# Patient Record
Sex: Male | Born: 1960
Health system: Southern US, Community
[De-identification: ages and names within clinical notes are randomized; demographics above are authoritative.]

## PROBLEM LIST (undated history)

## (undated) DIAGNOSIS — G4733 Obstructive sleep apnea (adult) (pediatric): Secondary | ICD-10-CM

## (undated) DIAGNOSIS — R55 Syncope and collapse: Secondary | ICD-10-CM

## (undated) DIAGNOSIS — E782 Mixed hyperlipidemia: Secondary | ICD-10-CM

## (undated) DIAGNOSIS — T884XXA Failed or difficult intubation, initial encounter: Secondary | ICD-10-CM

## (undated) DIAGNOSIS — F5104 Psychophysiologic insomnia: Secondary | ICD-10-CM

## (undated) DIAGNOSIS — R000 Tachycardia, unspecified: Secondary | ICD-10-CM

## (undated) DIAGNOSIS — I951 Orthostatic hypotension: Secondary | ICD-10-CM

## (undated) DIAGNOSIS — M797 Fibromyalgia: Secondary | ICD-10-CM

## (undated) DIAGNOSIS — I493 Ventricular premature depolarization: Secondary | ICD-10-CM

## (undated) DIAGNOSIS — I10 Essential (primary) hypertension: Secondary | ICD-10-CM

## (undated) DIAGNOSIS — K219 Gastro-esophageal reflux disease without esophagitis: Secondary | ICD-10-CM

## (undated) HISTORY — DX: Psychophysiologic insomnia: F51.04

## (undated) HISTORY — DX: Essential (primary) hypertension: I10

## (undated) HISTORY — DX: Tachycardia, unspecified: R00.0

## (undated) HISTORY — DX: Mixed hyperlipidemia: E78.2

## (undated) HISTORY — DX: Fibromyalgia: M79.7

## (undated) HISTORY — PX: CHOLECYSTECTOMY: SHX55

## (undated) HISTORY — DX: Orthostatic hypotension: I95.1

## (undated) HISTORY — DX: Ventricular premature depolarization: I49.3

## (undated) HISTORY — DX: Syncope and collapse: R55

## (undated) HISTORY — PX: APPENDECTOMY: SHX54

## (undated) HISTORY — DX: Obstructive sleep apnea (adult) (pediatric): G47.33

---

## 2009-02-03 ENCOUNTER — Ambulatory Visit: Payer: Self-pay | Admitting: Cardiology

## 2013-01-06 ENCOUNTER — Encounter: Payer: Self-pay | Admitting: Cardiology

## 2013-01-06 DIAGNOSIS — R079 Chest pain, unspecified: Secondary | ICD-10-CM

## 2013-01-07 ENCOUNTER — Encounter: Payer: Self-pay | Admitting: Internal Medicine

## 2013-01-07 ENCOUNTER — Encounter: Payer: Self-pay | Admitting: Physician Assistant

## 2013-01-07 ENCOUNTER — Encounter (HOSPITAL_COMMUNITY): Payer: Self-pay | Admitting: General Practice

## 2013-01-07 ENCOUNTER — Other Ambulatory Visit: Payer: Self-pay | Admitting: Physician Assistant

## 2013-01-07 ENCOUNTER — Encounter (HOSPITAL_COMMUNITY)
Admission: AD | Disposition: A | Discharge status: Home / Self Care | Payer: Self-pay | Source: Other Acute Inpatient Hospital | Attending: Cardiovascular Disease

## 2013-01-07 ENCOUNTER — Observation Stay (HOSPITAL_COMMUNITY)
Admission: AD | Admit: 2013-01-07 | Discharge: 2013-01-07 | Disposition: A | Discharge status: Home / Self Care | Payer: BC Managed Care – PPO | Source: Other Acute Inpatient Hospital | Attending: Cardiovascular Disease | Admitting: Cardiovascular Disease

## 2013-01-07 DIAGNOSIS — R079 Chest pain, unspecified: Secondary | ICD-10-CM

## 2013-01-07 DIAGNOSIS — R0602 Shortness of breath: Secondary | ICD-10-CM

## 2013-01-07 HISTORY — DX: Gastro-esophageal reflux disease without esophagitis: K21.9

## 2013-01-07 HISTORY — PX: LEFT HEART CATHETERIZATION WITH CORONARY ANGIOGRAM: SHX5451

## 2013-01-07 SURGERY — LEFT HEART CATHETERIZATION WITH CORONARY ANGIOGRAM
Anesthesia: LOCAL

## 2013-01-07 MED ORDER — HEPARIN (PORCINE) IN NACL 2-0.9 UNIT/ML-% IJ SOLN
INTRAMUSCULAR | Status: AC
  Filled 2013-01-07: qty 1000

## 2013-01-07 MED ORDER — ASPIRIN EC 81 MG PO TBEC: 81.0000 mg | DELAYED_RELEASE_TABLET | Freq: Every day | ORAL | Status: DC

## 2013-01-07 MED ORDER — METOPROLOL SUCCINATE ER 50 MG PO TB24: 50.0000 mg | ORAL_TABLET | Freq: Every day | ORAL | Status: DC

## 2013-01-07 MED ORDER — ONDANSETRON HCL 4 MG/2ML IJ SOLN: 4.0000 mg | Freq: Four times a day (QID) | INTRAMUSCULAR | Status: DC | PRN

## 2013-01-07 MED ORDER — SODIUM CHLORIDE 0.9 % IV SOLN: 1.0000 mL/kg/h | INTRAVENOUS | Status: DC

## 2013-01-07 MED ORDER — LIDOCAINE HCL (PF) 1 % IJ SOLN
INTRAMUSCULAR | Status: AC
  Filled 2013-01-07: qty 30

## 2013-01-07 MED ORDER — SODIUM CHLORIDE 0.9 % IJ SOLN: 3.0000 mL | Freq: Two times a day (BID) | INTRAMUSCULAR | Status: DC

## 2013-01-07 MED ORDER — NITROGLYCERIN 0.4 MG SL SUBL: 0.4000 mg | SUBLINGUAL_TABLET | SUBLINGUAL | Status: DC | PRN

## 2013-01-07 MED ORDER — HEPARIN SODIUM (PORCINE) 1000 UNIT/ML IJ SOLN
INTRAMUSCULAR | Status: AC
  Filled 2013-01-07: qty 1

## 2013-01-07 MED ORDER — SODIUM CHLORIDE 0.9 % IJ SOLN: 3.0000 mL | INTRAMUSCULAR | Status: DC | PRN

## 2013-01-07 MED ORDER — VERAPAMIL HCL 2.5 MG/ML IV SOLN
INTRAVENOUS | Status: AC
  Filled 2013-01-07: qty 2

## 2013-01-07 MED ORDER — SODIUM CHLORIDE 0.9 % IV SOLN
INTRAVENOUS | Status: DC
  Administered 2013-01-07: 14:00:00 via INTRAVENOUS

## 2013-01-07 MED ORDER — FENTANYL CITRATE 0.05 MG/ML IJ SOLN
INTRAMUSCULAR | Status: AC
  Filled 2013-01-07: qty 2

## 2013-01-07 MED ORDER — PANTOPRAZOLE SODIUM 40 MG PO TBEC: 40.0000 mg | DELAYED_RELEASE_TABLET | Freq: Every day | ORAL | Status: DC

## 2013-01-07 MED ORDER — MIDAZOLAM HCL 2 MG/2ML IJ SOLN
INTRAMUSCULAR | Status: AC
  Filled 2013-01-07: qty 2

## 2013-01-07 MED ORDER — ACETAMINOPHEN 325 MG PO TABS: 650.0000 mg | ORAL_TABLET | ORAL | Status: DC | PRN

## 2013-01-07 MED ORDER — LISINOPRIL 10 MG PO TABS: 10.0000 mg | ORAL_TABLET | Freq: Every day | ORAL | Status: DC

## 2013-01-07 MED ORDER — SODIUM CHLORIDE 0.9 % IV SOLN: 250.0000 mL | INTRAVENOUS | Status: DC | PRN

## 2013-01-07 MED ORDER — ATORVASTATIN CALCIUM 80 MG PO TABS
80.0000 mg | ORAL_TABLET | Freq: Every day | ORAL | Status: DC
  Filled 2013-01-07: qty 1

## 2013-01-07 MED ORDER — ASPIRIN 81 MG PO CHEW: 324.0000 mg | CHEWABLE_TABLET | ORAL | Status: DC

## 2013-01-07 NOTE — Progress Notes (Signed)
TR BAND REMOVAL  LOCATION:  right radial  DEFLATED PER PROTOCOL:  yes  TIME BAND OFF / DRESSING APPLIED:   1845   SITE UPON ARRIVAL:   Level 0  SITE AFTER BAND REMOVAL:  Level 0  REVERSE ALLEN'S TEST:    positive  CIRCULATION SENSATION AND MOVEMENT:  Within Normal Limits  yes  COMMENTS:    

## 2013-01-07 NOTE — Progress Notes (Signed)
Patient without problems post radial approach cardiac cath.  Right radial site treated per protocol; level 0, negative reverse allen's.  Ward Givens, NP aware.  Patient DC'd home.

## 2013-01-07 NOTE — CV Procedure (Signed)
   Cardiac Catheterization Procedure Note  Name: Anthony Sherman MRN: [id number] DOB: 10-13-1960  Procedure: Left Heart Cath, Selective Coronary Angiography, LV angiography  Indication:  Chest pain suggestive of unstable angina.  Nondiagnostic ETT  Procedural details: The right radial was prepped, draped, and anesthetized with 1% lidocaine. Using modified Seldinger technique, a 5 French sheath was introduced into the right radial artery. Standard Judkins catheters were used for coronary angiography and left ventriculography. Catheter exchanges were performed over a guidewire. There were no immediate procedural complications. The patient was transferred to the post catheterization recovery area for further monitoring.  Procedural Findings:  Hemodynamics:     AO 128/80    LV 128/20   Coronary angiography:   Coronary dominance: Right  Left mainstem:   NL  Left anterior descending (LAD):   NL moderate sized.  D1 moderate and normal.  D2 moderate and normal  Left circumflex (LCx):  AV groove normal.  RI large and normal.  OM moderated sized and normal.  Right coronary artery (RCA):  Large AV groove normal.  PDA moderate sized and normal.  PL x 2 and normal.  Left ventriculography: Left ventricular systolic function is normal, LVEF is estimated at 55%, there is no significant mitral regurgitation   Final Conclusions:  Normal coronaries.  NL LV function  Recommendations: No further cardiac work up.   Rollene Rotunda 01/07/2013, 4:15 PM

## 2013-01-13 ENCOUNTER — Ambulatory Visit (INDEPENDENT_AMBULATORY_CARE_PROVIDER_SITE_OTHER): Payer: BC Managed Care – PPO | Admitting: Cardiology

## 2013-01-13 ENCOUNTER — Encounter: Payer: Self-pay | Admitting: Cardiology

## 2013-01-13 VITALS — BP 146/97 | HR 86 | Ht 68.0 in | Wt 189.8 lb

## 2013-01-13 DIAGNOSIS — R002 Palpitations: Secondary | ICD-10-CM | POA: Insufficient documentation

## 2013-01-13 DIAGNOSIS — I10 Essential (primary) hypertension: Secondary | ICD-10-CM | POA: Insufficient documentation

## 2013-01-13 DIAGNOSIS — E782 Mixed hyperlipidemia: Secondary | ICD-10-CM | POA: Insufficient documentation

## 2013-01-13 NOTE — Assessment & Plan Note (Signed)
Documentation of PVCs on standard GXT, presumably these are symptomatic. No VT, dizziness, syncope. Recent cardiac workup reassuring including cardiac catheterization demonstrating normal LVEF and no significant CAD. Recommend that he try increasing Toprol-XL to 75 mg daily, reduce caffeine in the diet, consider basic walking regimen for exercise. In light of his prior environmental exposures as outlined, may want to consider discussing further pulmonary workup with Dr. Dimas Aguas, perhaps PFTs and if abnormal even consideration of a high-resolution chest CT. Our followup will be PRN.

## 2013-01-13 NOTE — H&P (Addendum)
The patient was seen by Dr. Diona Browner and the note is as dictated.  He presents for cardiac cath.  The patient understands that risks included but are not limited to stroke (1 in 1000), death (1 in 1000), kidney failure [usually temporary] (1 in 500), bleeding (1 in 200), allergic reaction [possibly serious] (1 in 200).  The patient understands and agrees to proceed.

## 2013-01-13 NOTE — Progress Notes (Signed)
   Clinical Summary Mr. Summerlin is a 52 y.o.male presenting for posthospital followup. We saw him in consultation just recently having been referred to the ER by Dr. Dimas Aguas with recent symptoms concerning for possible angina. Cardiac markers did not demonstrate ACS, and ECG was nonspecific. He had already undergone a standard GXT the day before which did not demonstrate abnormal ST segment changes, however did show PVCs and some bigeminy with hypertensive response. In light of his pretest probability for CAD and risks factor profile, he was transferred to [facility] for diagnostic cardiac catheterization. Fortunately, this procedure demonstrated normal coronary arteries and LV function.  He is here with his wife today. Main symptom at this point is intermittent palpitations, no dizziness or syncope. Presumably he is feeling PVCs still. He has been on current dose of Toprol-XL for several years.  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 No definite history of lung disease. Chest x-ray at [facility] did not demonstrate any active findings. He has not undergone other pulmonary evaluation.  No Known Allergies  Current Outpatient Prescriptions  Medication Sig Dispense Refill  . aspirin EC 81 MG tablet Take 81 mg by mouth daily.      . cetirizine (ZYRTEC) 10 MG tablet Take 10 mg by mouth daily as needed for allergies.      Marland Kitchen lisinopril (PRINIVIL,ZESTRIL) 10 MG tablet Take 10 mg by mouth daily.      . metoprolol succinate (TOPROL-XL) 50 MG 24 hr tablet Take 75 mg by mouth daily. Take with or immediately following a meal.      . pantoprazole (PROTONIX) 40 MG tablet Take 40 mg by mouth daily.      . rosuvastatin (CRESTOR) 5 MG tablet Take 5 mg by mouth daily.       No current facility-administered medications for this visit.    Past Medical History  Diagnosis Date  . Essential hypertension, benign   . Mixed hyperlipidemia   . GERD  (gastroesophageal reflux disease)   . Syncope     2010    Social History Mr. Marley reports that he has never smoked. He has never used smokeless tobacco. Mr. Hazard reports that he does not drink alcohol.  Review of Systems Negative except as outlined.  Physical Examination Filed Vitals:   01/13/13 1455  BP: 146/97  Pulse: 86   Filed Weights   01/13/13 1453 01/13/13 1455  Weight: 189 lb 12.8 oz (86.093 kg) 189 lb 12.8 oz (86.093 kg)   Comfortable at rest. HEENT: Conjunctiva and lids normal, oropharynx clear. Neck: Supple, no elevated JVP or carotid bruits, no thyromegaly. Lungs: Clear to auscultation, nonlabored breathing at rest. Cardiac: Regular rate and rhythm, no S3 or significant systolic murmur, no pericardial rub. Abdomen: Soft, bowel sounds present. Extremities: No pitting edema, distal pulses 2+.   Problem List and Plan   Palpitations Documentation of PVCs on standard GXT, presumably these are symptomatic. No VT, dizziness, syncope. Recent cardiac workup reassuring including cardiac catheterization demonstrating normal LVEF and no significant CAD. Recommend that he try increasing Toprol-XL to 75 mg daily, reduce caffeine in the diet, consider basic walking regimen for exercise. In light of his prior environmental exposures as outlined, may want to consider discussing further pulmonary workup with Dr. Dimas Aguas, perhaps PFTs and if abnormal even consideration of a high-resolution chest CT. Our followup will be PRN.    Jonelle Sidle, M.D., F.A.C.C.

## 2013-01-13 NOTE — Patient Instructions (Addendum)
Your physician has recommended you make the following change in your medication: Increase your toprol xl to 75 mg daily. Please take 1&1/2 tablets daily of your 50 mg. Please have this refilled by Dr. Jeannette How office. All other medications will remain the same. Follow up as needed.

## 2013-01-14 NOTE — Discharge Summary (Signed)
Patient ID: SANTHIAGO COLLINGSWORTH,  MRN: [id number], DOB/AGE: Oct 19, 1960 52 y.o.  Admit date: 01/07/2013 Discharge date: 01/07/2013  Primary Care Provider: Selinda Flavin Primary Cardiologist: Ival Bible, MD   Discharge Diagnoses Chest Pain  **nonobs dzs on cath on 6/4. HL HTN GERD Syncope  Allergies No Known Allergies  Procedures  Procedural Findings:  Hemodynamics:  AO 128/80  LV 128/20  Coronary angiography:  Coronary dominance: Right  Left mainstem: NL  Left anterior descending (LAD): NL moderate sized. D1 moderate and normal. D2 moderate and normal  Left circumflex (LCx): AV groove normal. RI large and normal. OM moderated sized and normal.  Right coronary artery (RCA): Large AV groove normal. PDA moderate sized and normal. PL x 2 and normal.  Left ventriculography: Left ventricular systolic function is normal, LVEF is estimated at 55%, there is no significant mitral regurgitation  _____________   History of Present Illness  52 y/o male with the above problem list who recently presented to [facility] with chest pain.  ECG was non-specific and cardiac markers were normal.  He He had undergone an ETT the day prior, which did not show acute ST/T changes but did show PVC's and bigeminy with a hypertensive response to exercise.  In light of symptoms and risk factors, he was transferred to [facility] for further evaluation.  Hospital Course  Pt underwent diagnostic catheterization revealing normal coronary arteries and normal LV function.  Following sheath removal from the right radial, he was monitored for the appropriate amount of time and subsequently ambulated without recurrent symptoms or limitations.  He was subsequently discharged home in good condition.  Discharge Vitals Blood pressure 128/79, pulse 55, temperature 97.6 F (36.4 C), temperature source Oral, resp. rate 20, height 5\' 8"  (1.727 m), weight 184 lb 4.8 oz (83.598 kg), SpO2 100.00%.  Filed Weights   01/07/13 1300   Weight: 184 lb 4.8 oz (83.598 kg)   Labs  None  Disposition  Pt is being discharged home today in good condition.  Follow-up Plans & Appointments  F/u with Dr. Diona Browner as scheduled.  Discharge Medications    Medication List    ASK your doctor about these medications       aspirin EC 81 MG tablet  Take 81 mg by mouth daily.     cetirizine 10 MG tablet  Commonly known as:  ZYRTEC  Take 10 mg by mouth daily as needed for allergies.     lisinopril 10 MG tablet  Commonly known as:  PRINIVIL,ZESTRIL  Take 10 mg by mouth daily.     metoprolol succinate 50 MG 24 hr tablet  Commonly known as:  TOPROL-XL  Take 75 mg by mouth daily. Take with or immediately following a meal.     pantoprazole 40 MG tablet  Commonly known as:  PROTONIX  Take 40 mg by mouth daily.     rosuvastatin 5 MG tablet  Commonly known as:  CRESTOR  Take 5 mg by mouth daily.       Outstanding Labs/Studies  none  Duration of Discharge Encounter   Greater than 30 minutes including physician time.  Signed, Nicolasa Ducking NP 01/14/2013, 12:16 PM

## 2013-01-23 ENCOUNTER — Encounter: Payer: Self-pay | Admitting: Internal Medicine

## 2013-01-23 LAB — PULMONARY FUNCTION TEST

## 2013-01-27 ENCOUNTER — Telehealth: Payer: Self-pay | Admitting: Cardiology

## 2013-01-27 DIAGNOSIS — R002 Palpitations: Secondary | ICD-10-CM

## 2013-01-27 DIAGNOSIS — R55 Syncope and collapse: Secondary | ICD-10-CM

## 2013-01-27 NOTE — Telephone Encounter (Signed)
Dr. Selinda Flavin called the office today requesting to speak with Dr. Diona Browner. He states that Golden Gilreath is being seen today with more irregular heartbeats. He  would like for patient to wear an event monitor.  Dr. Jeannette How ext [phone].

## 2013-01-28 ENCOUNTER — Other Ambulatory Visit: Payer: Self-pay | Admitting: *Deleted

## 2013-01-28 DIAGNOSIS — R002 Palpitations: Secondary | ICD-10-CM

## 2013-01-28 DIAGNOSIS — R55 Syncope and collapse: Secondary | ICD-10-CM

## 2013-01-28 NOTE — Telephone Encounter (Signed)
I was not in the office when this note sent to me. Patient was just recently seen following a reassuring cardiac catheterization. He has had documented PVCs. If Dr. Dimas Aguas would like a cardiac monitor, perhaps a 24-hour Holter would be reasonable to document PVC frequency. If the patient is having more episodic palpitations, longer monitoring may be necessary. Please check with Dr. Dimas Aguas as to the duration of monitoring requested and we can arrange it from there.

## 2013-01-28 NOTE — Telephone Encounter (Signed)
Spoke with Dr. Dimas Aguas and he is requesting a 30 day event monitor due to patient having near syncope episodes along with more palpitations. Nurse informed MD and patient that an event monitor would be arranged.

## 2013-02-04 DIAGNOSIS — R55 Syncope and collapse: Secondary | ICD-10-CM

## 2013-02-04 DIAGNOSIS — R002 Palpitations: Secondary | ICD-10-CM

## 2013-02-10 ENCOUNTER — Telehealth: Payer: Self-pay | Admitting: Cardiology

## 2013-02-10 NOTE — Telephone Encounter (Signed)
Returned call to patient as I did not see a designated release signed with wife's name.  Explained to him that monitor results will be forwarded to Dr. Dimas Aguas as he was the requesting MD.  Patient verbalized understanding.

## 2013-02-10 NOTE — Telephone Encounter (Signed)
[name] called stating that Anthony Sherman from our office called yesterday stating that she was calling on monitor results. Patient is very concerned about this telephone call. I do not see a telephone note in system. Please call at [facility] ext # [phone] and ask For Boris Sharper,

## 2013-02-12 ENCOUNTER — Telehealth: Payer: Self-pay | Admitting: Cardiology

## 2013-02-12 DIAGNOSIS — R002 Palpitations: Secondary | ICD-10-CM

## 2013-02-12 NOTE — Telephone Encounter (Signed)
Called and informed Dr. Jeannette How office of plan and also to check status of whether or not an echo has been done. Per Vikki Ports at PCP office, no echo has been done. Wife informed via voicemail.

## 2013-02-12 NOTE — Telephone Encounter (Signed)
Mrs. Minotti called the office today stating that her husband is wearing a heart monitor. Continues to feel very weak. Mrs. Folkert states that monitor company keeps calling him wanting to know If he is passing out.  States that Dr.Howard's office is requesting patient been seen soon. Please advise. Mr. Heggie will be coming by the office today to sign form giving [place] permission to speak To his wife who is a Engineer, civil (consulting) at [employer].

## 2013-02-12 NOTE — Telephone Encounter (Signed)
Received request from PCP for patient to be seen ASAP for PVC's documented by event monitor. Patient and wife informed that PA suggest PVC's be managed medically and staying away from caffeine. Wife said that he has been on the 200 mg toprol xl for 2 days now. Patient and wife informed that PA suggest receiving a response from MD r/e PCP office request of patient being seen in office ASAP.

## 2013-02-12 NOTE — Telephone Encounter (Signed)
Telephone communications noted. Dr. Dimas Aguas requested that this monitor be worn, and our office arranged it for him. The patient has previously had documented PVCs based on his workup so far including a GXT and ultimately a reassuring cardiac catheterization demonstrating no significant CAD. LVEF was 55% at catheterization by ventriculogram.  When I saw him for a post catheterization followup, he was not endorsing any specific dizziness or syncope. Did describe palpitations which were presumably his PVCs. If he is still experiencing symptomatic PVCs on high-dose beta blocker, we may need to ask EP to see him - please see if an office visit can be scheduled with one of our EP providers. I would recommend an echocardiogram if this has not been ordered for better structural cardiac evaluation first.

## 2013-02-18 ENCOUNTER — Other Ambulatory Visit (INDEPENDENT_AMBULATORY_CARE_PROVIDER_SITE_OTHER): Payer: PRIVATE HEALTH INSURANCE

## 2013-02-18 ENCOUNTER — Other Ambulatory Visit: Payer: Self-pay

## 2013-02-18 DIAGNOSIS — R002 Palpitations: Secondary | ICD-10-CM

## 2013-02-20 ENCOUNTER — Telehealth: Payer: Self-pay | Admitting: *Deleted

## 2013-02-20 NOTE — Telephone Encounter (Signed)
Message copied by Eustace Moore on Fri Feb 20, 2013 10:06 AM ------      Message from: MCDOWELL, Illene Bolus      Created: Thu Feb 19, 2013  8:17 AM       Report reviewed. Mild LVH with normal LVEF 60-65%, normal diastolic parameters. No major valvular abnormalities. Right ventricle is also described as being of normal size and function. This is reassuring in the setting of frequent PVCs, although with his symptoms as previously noted, EP consultation has been requested, since he is still symptomatic on high-dose beta blocker therapy. ------

## 2013-02-20 NOTE — Telephone Encounter (Signed)
Patient informed. 

## 2013-03-16 ENCOUNTER — Ambulatory Visit (INDEPENDENT_AMBULATORY_CARE_PROVIDER_SITE_OTHER): Payer: PRIVATE HEALTH INSURANCE | Admitting: Internal Medicine

## 2013-03-16 ENCOUNTER — Encounter: Payer: Self-pay | Admitting: Internal Medicine

## 2013-03-16 VITALS — BP 140/98 | HR 77 | Ht 68.0 in | Wt 193.0 lb

## 2013-03-16 DIAGNOSIS — I4949 Other premature depolarization: Secondary | ICD-10-CM

## 2013-03-16 DIAGNOSIS — R002 Palpitations: Secondary | ICD-10-CM

## 2013-03-16 DIAGNOSIS — I493 Ventricular premature depolarization: Secondary | ICD-10-CM | POA: Insufficient documentation

## 2013-03-16 MED ORDER — FLECAINIDE ACETATE 50 MG PO TABS: 50.0000 mg | ORAL_TABLET | Freq: Two times a day (BID) | ORAL | Status: DC

## 2013-03-16 NOTE — Patient Instructions (Addendum)
Your physician recommends that you schedule a follow-up appointment 3 weeks with Dr Johney Frame  Your physician has recommended you make the following change in your medication:  1) Start Flecainide 50mg  twice daily 2) Decrease Toprol to 50mg  daily

## 2013-03-16 NOTE — Progress Notes (Signed)
Primary Care Physician: Selinda Flavin, MD Referring Physician: Simona Huh, MD   Anthony Sherman is a 52 y.o. male with a h/o non obstructive coronary disease by cath this year, hypertension, GERD, palpitations, and syncope.  Four months ago, he developed chest pain, shortness of breath and palpitations.  He has been found to have PVC's.  Cardiac work up to date has included GXT, echo (EF 60-65% with mild MR and no RWMA), cath (no coronary disease), and labwork which have all been unremarkable. Recent event monitor demonstrated persistent frequent PVC's (bigeminal at times) despite Toprol XL 200mg  daily.   He has a history of syncope about 4 years ago.  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  He reports that he thinks he was dehydrated that day from working outside.  He was evaluated and no significant findings were observed.  He was felt to have vasovagal syncope.  He has had no other episodes of syncope.  He has had persistent shortness of breath and exercise intolerance for the last 4 months.  He has been referred to EP for further evaluation.      Past Medical History  Diagnosis Date  . Essential hypertension, benign   . Mixed hyperlipidemia   . GERD (gastroesophageal reflux disease)   . Syncope     2010   Past Surgical History  Procedure Laterality Date  . Cholecystectomy  ~ 2000  . Appendectomy  ~ 2000    Current Outpatient Prescriptions  Medication Sig Dispense Refill  . aspirin EC 81 MG tablet Take 81 mg by mouth daily.      . cetirizine (ZYRTEC) 10 MG tablet Take 10 mg by mouth daily as needed for allergies.      Marland Kitchen lisinopril (PRINIVIL,ZESTRIL) 10 MG tablet Take 10 mg by mouth daily.      . metoprolol succinate (TOPROL-XL) 100 MG 24 hr tablet Take 100 mg by mouth daily. Take 100MG  IN THE EVENI NG DAILY      . metoprolol succinate (TOPROL-XL) 50 MG 24 hr tablet Take 50 mg by mouth daily. TAKE 50MG  IN THE MORNING DAILY      . pantoprazole  (PROTONIX) 40 MG tablet Take 40 mg by mouth daily.      . rosuvastatin (CRESTOR) 5 MG tablet Take 5 mg by mouth daily.       No current facility-administered medications for this visit.    No Known Allergies  History   Social History  . Marital Status: Married    Spouse Name: N/A    Number of Children: N/A  . Years of Education: N/A   Occupational History  . Not on file.   Social History Main Topics  . Smoking status: Never Smoker   . Smokeless tobacco: Never Used  . Alcohol Use: No  . Drug Use: No  . Sexually Active: Yes   Other Topics Concern  . Not on file   Social History Narrative  . No narrative on file    Family History  Problem Relation Age of Onset  . CAD Mother   denies FH of sudden death or ventricular arrhythmia  ROS- All systems are reviewed and negative except as per the HPI above  Physical Exam: Filed Vitals:   03/16/13 1433  BP: 140/98  Pulse: 77  Height: 5\' 8"  (1.727 m)  Weight: 193 lb (87.544 kg)    GEN- The patient is well appearing, alert and oriented x 3 today.   Head-  normocephalic, atraumatic Eyes-  Sclera clear, conjunctiva pink Ears- hearing intact Oropharynx- clear Neck- supple, no JVP Lymph- no cervical lymphadenopathy Lungs- Clear to ausculation bilaterally, normal work of breathing Heart- Regular rate and rhythm, no murmurs, rubs or gallops, PMI not laterally displaced GI- soft, NT, ND, + BS Extremities- no clubbing, cyanosis, or edema MS- no significant deformity or atrophy Skin- no rash or lesion Psych- euthymic mood, full affect Neuro- strength and sensation are intact  EKG today reveals sinus rhythm with PVCs (LBB L superior axis) Recent event monitor is reviewed which reveals sinus with PVCs and frequent bigeminy which correlates to symptoms,  No afib/NSVT/pauses/AV block Echo is reviewed Cath is reviewed  Assessment and Plan:  1. Symptomatic PVCs The patient has very frequent and symptomatic PVCs.  He has  failed medical therapy with toprol. Therapeutic strategies for PVCs including medicine and ablation were discussed in detail with the patient today. Risk, benefits, and alternatives to EP study and radiofrequency ablation were also discussed in detail today.  At this time, he would prefer medical therapy.  I discussed flecainide as an option.  Risks and benefits of this medicine were discussed including potential for proarrhythmia.  He understands risks and wishes to proceed.  I will therefore reduce toprol to 50mg  daily and add flecainide 50mg  BID.  Flecainide can be increased if symptoms do not improve.  If he fails medical therapy with flecainide then he may be more willing to consider ablation.  Return in 3 weeks for further assessment.

## 2013-03-18 ENCOUNTER — Telehealth: Payer: Self-pay | Admitting: Internal Medicine

## 2013-03-18 NOTE — Telephone Encounter (Signed)
F/u  Pt calling back because he hasn't heard from anyone since he left a msg this morning

## 2013-03-18 NOTE — Telephone Encounter (Addendum)
Spoke with patient he has stopped taking the Flecainide due to SOB(which he had already) and HA. His PVC's are much worse without and therefore I have asked him  to go ahead and take the extra Metoprolol tonight and I will discuss with Dr Johney Frame in the morning

## 2013-03-18 NOTE — Telephone Encounter (Signed)
New Prob  Pt states that since he has started taking the flecainide he is having shortness of breath, headaches and is tired a lot.

## 2013-03-19 ENCOUNTER — Telehealth: Payer: Self-pay | Admitting: Internal Medicine

## 2013-03-19 MED ORDER — PROPAFENONE HCL 150 MG PO TABS: 150.0000 mg | ORAL_TABLET | Freq: Three times a day (TID) | ORAL | Status: DC

## 2013-03-19 NOTE — Telephone Encounter (Signed)
Patient's wife(RN) called back with concerns in regards to the Rythmol 150mg  tid.  She says there is a black box warning and she just wants to talk with him about it.  She also stated her father in law is a retired Teacher, early years/pre and they are all very concerned with the side effects and what the insert is saying.  I let her know that Dr Johney Frame was not here and was in our [place] office but would be here 1/2 day tomorrow and I will send a message to him.

## 2013-03-19 NOTE — Telephone Encounter (Signed)
Follow up  Pt wife returning a call from last night.

## 2013-03-19 NOTE — Telephone Encounter (Signed)
Discussed with Dr Johney Frame and he can stay off the flecainide, keep the Metoprolol at 50mg  daily and start Rythmol 150mg  three times daily.  If he has side effects from this or feels he can not tolerate to call me back.

## 2013-03-19 NOTE — Telephone Encounter (Signed)
Follow up  Pt wife would like to speak with you regarding her husband. She said you spoke with him earlier today.

## 2013-03-20 ENCOUNTER — Institutional Professional Consult (permissible substitution): Payer: Self-pay | Admitting: Internal Medicine

## 2013-04-08 ENCOUNTER — Encounter: Payer: Self-pay | Admitting: *Deleted

## 2013-04-08 ENCOUNTER — Ambulatory Visit (INDEPENDENT_AMBULATORY_CARE_PROVIDER_SITE_OTHER): Payer: PRIVATE HEALTH INSURANCE | Admitting: Internal Medicine

## 2013-04-08 ENCOUNTER — Encounter: Payer: Self-pay | Admitting: Internal Medicine

## 2013-04-08 VITALS — BP 112/84 | Ht 68.0 in | Wt 194.6 lb

## 2013-04-08 DIAGNOSIS — I493 Ventricular premature depolarization: Secondary | ICD-10-CM

## 2013-04-08 DIAGNOSIS — I10 Essential (primary) hypertension: Secondary | ICD-10-CM

## 2013-04-08 DIAGNOSIS — I4949 Other premature depolarization: Secondary | ICD-10-CM

## 2013-04-08 DIAGNOSIS — R002 Palpitations: Secondary | ICD-10-CM

## 2013-04-08 LAB — CBC WITH DIFFERENTIAL/PLATELET
Basophils Absolute: 0 10*3/uL (ref 0.0–0.1)
HCT: 46.2 % (ref 39.0–52.0)
Lymphocytes Relative: 33.4 % (ref 12.0–46.0)
Lymphs Abs: 2.3 10*3/uL (ref 0.7–4.0)
Monocytes Relative: 8.5 % (ref 3.0–12.0)
Neutrophils Relative %: 55 % (ref 43.0–77.0)
Platelets: 206 10*3/uL (ref 150.0–400.0)
RDW: 14.2 % (ref 11.5–14.6)

## 2013-04-08 LAB — BASIC METABOLIC PANEL
BUN: 17 mg/dL (ref 6–23)
Calcium: 9.7 mg/dL (ref 8.4–10.5)
Creatinine, Ser: 1.2 mg/dL (ref 0.4–1.5)
GFR: 65.75 mL/min (ref >60.00)
Glucose, Bld: 90 mg/dL (ref 70–99)

## 2013-04-08 NOTE — Telephone Encounter (Signed)
Patient coming in for appointment today.

## 2013-04-08 NOTE — Progress Notes (Signed)
 Primary Care Physician: HOWARD, KEVIN, MD Referring Physician: Sam McDowell, MD   Anthony Sherman is a 51 y.o. male with a h/o non obstructive coronary disease by cath this year, hypertension, GERD, palpitations, and syncope.   He has been found to have PVC's.  Cardiac work up to date has included GXT, echo (EF 60-65% with mild MR and no RWMA), cath (no coronary disease), and labwork which have all been unremarkable. Recent event monitor demonstrated persistent frequent PVC's (bigeminal at times) despite Toprol XL 200mg daily.  When I saw him last, we decreased toprol to 50mg daily and added flecainide.  He did not tolerate flecainide due to SOB.  I therefore switched him to rhythmol which he reports has causes daily headaches.  He continues to have episodes of palpitations and symptoms of PVCs.    Past Medical History  Diagnosis Date  . Essential hypertension, benign   . Mixed hyperlipidemia   . GERD (gastroesophageal reflux disease)   . Syncope     2010   Past Surgical History  Procedure Laterality Date  . Cholecystectomy  ~ 2000  . Appendectomy  ~ 2000    Current Outpatient Prescriptions  Medication Sig Dispense Refill  . aspirin EC 81 MG tablet Take 81 mg by mouth daily.      . cetirizine (ZYRTEC) 10 MG tablet Take 10 mg by mouth daily as needed for allergies.      . lisinopril (PRINIVIL,ZESTRIL) 10 MG tablet Take 10 mg by mouth daily.      . metoprolol succinate (TOPROL-XL) 50 MG 24 hr tablet Take 50 mg by mouth daily. TAKE 50MG IN THE MORNING DAILY      . pantoprazole (PROTONIX) 40 MG tablet Take 40 mg by mouth daily.      . propafenone (RYTHMOL) 150 MG tablet Take 1 tablet (150 mg total) by mouth every 8 (eight) hours.  90 tablet  6  . rosuvastatin (CRESTOR) 5 MG tablet Take 5 mg by mouth daily.       No current facility-administered medications for this visit.    No Known Allergies  History   Social History  . Marital Status: Married    Spouse Name: N/A    Number  of Children: N/A  . Years of Education: N/A   Occupational History  . Not on file.   Social History Main Topics  . Smoking status: Never Smoker   . Smokeless tobacco: Never Used  . Alcohol Use: No  . Drug Use: No  . Sexual Activity: Yes   Other Topics Concern  . Not on file   Social History Narrative  . No narrative on file    Family History  Problem Relation Age of Onset  . CAD Mother   denies FH of sudden death or ventricular arrhythmia  ROS- All systems are reviewed and negative except as per the HPI above  Physical Exam: Filed Vitals:   04/08/13 1349  BP: 112/84  Height: 5' 8" (1.727 m)  Weight: 194 lb 9.6 oz (88.27 kg)    GEN- The patient is well appearing, alert and oriented x 3 today.   Head- normocephalic, atraumatic Eyes-  Sclera clear, conjunctiva pink Ears- hearing intact Oropharynx- clear Neck- supple, no JVP Lymph- no cervical lymphadenopathy Lungs- Clear to ausculation bilaterally, normal work of breathing Heart- Regular rate and rhythm with occasional ectopy, no murmurs, rubs or gallops, PMI not laterally displaced GI- soft, NT, ND, + BS Extremities- no clubbing, cyanosis, or edema MS-   no significant deformity or atrophy Skin- no rash or lesion Psych- euthymic mood, full affect Neuro- strength and sensation are intact  EKG today reveals sinus rhythm with PVCs (LBB L superior axis) Recent event monitor is reviewed which reveals sinus with PVCs and frequent bigeminy which correlates to symptoms,  No afib/NSVT/pauses/AV block Echo is reviewed Cath is reviewed  Assessment and Plan:  1. Symptomatic PVCs He has failed medical therapy with rhythmol, flecainide, and toprol.  Therapeutic strategies for PVCs including medicine and ablation were discussed in detail with the patient today. Risk, benefits, and alternatives to EP study and radiofrequency ablation were also discussed in detail today. These risks include but are not limited to stroke,  bleeding, vascular damage, tamponade, perforation, damage to the heart and other structures, AV block requiring pacemaker, worsening renal function, and death. The patient understands these risk and wishes to proceed.  We will therefore proceed with catheter ablation at the next available time.  2. HTN Stable No change required today   

## 2013-04-08 NOTE — Patient Instructions (Signed)

## 2013-04-09 ENCOUNTER — Encounter (HOSPITAL_COMMUNITY): Payer: Self-pay | Admitting: Pharmacy Technician

## 2013-04-14 ENCOUNTER — Ambulatory Visit (HOSPITAL_COMMUNITY): Payer: PRIVATE HEALTH INSURANCE | Admitting: Anesthesiology

## 2013-04-14 ENCOUNTER — Encounter (HOSPITAL_COMMUNITY)
Admission: RE | Disposition: A | Discharge status: Home / Self Care | Payer: Self-pay | Source: Ambulatory Visit | Attending: Internal Medicine

## 2013-04-14 ENCOUNTER — Ambulatory Visit (HOSPITAL_COMMUNITY)
Admission: RE | Admit: 2013-04-14 | Discharge: 2013-04-14 | Disposition: A | Discharge status: Home / Self Care | Payer: PRIVATE HEALTH INSURANCE | Source: Ambulatory Visit | Attending: Internal Medicine | Admitting: Internal Medicine

## 2013-04-14 ENCOUNTER — Encounter (HOSPITAL_COMMUNITY): Payer: Self-pay | Admitting: Anesthesiology

## 2013-04-14 DIAGNOSIS — I4729 Other ventricular tachycardia: Secondary | ICD-10-CM | POA: Insufficient documentation

## 2013-04-14 DIAGNOSIS — I472 Ventricular tachycardia, unspecified: Secondary | ICD-10-CM | POA: Insufficient documentation

## 2013-04-14 DIAGNOSIS — I10 Essential (primary) hypertension: Secondary | ICD-10-CM | POA: Insufficient documentation

## 2013-04-14 DIAGNOSIS — R002 Palpitations: Secondary | ICD-10-CM | POA: Diagnosis present

## 2013-04-14 DIAGNOSIS — I493 Ventricular premature depolarization: Secondary | ICD-10-CM | POA: Diagnosis present

## 2013-04-14 HISTORY — PX: SUPRAVENTRICULAR TACHYCARDIA ABLATION: SHX5492

## 2013-04-14 HISTORY — PX: OTHER SURGICAL HISTORY: SHX169

## 2013-04-14 SURGERY — SUPRAVENTRICULAR TACHYCARDIA ABLATION
Anesthesia: Monitor Anesthesia Care

## 2013-04-14 MED ORDER — HYDROCODONE-ACETAMINOPHEN 5-325 MG PO TABS
1.0000 | ORAL_TABLET | ORAL | Status: DC | PRN
  Administered 2013-04-14: 15:00:00 2 via ORAL
  Filled 2013-04-14: qty 2

## 2013-04-14 MED ORDER — FENTANYL CITRATE 0.05 MG/ML IJ SOLN
INTRAMUSCULAR | Status: DC | PRN
  Administered 2013-04-14: 50 ug via INTRAVENOUS
  Administered 2013-04-14: 100 ug via INTRAVENOUS

## 2013-04-14 MED ORDER — SODIUM CHLORIDE 0.9 % IV SOLN: 250.0000 mL | INTRAVENOUS | Status: DC | PRN

## 2013-04-14 MED ORDER — SODIUM CHLORIDE 0.9 % IJ SOLN: 3.0000 mL | INTRAMUSCULAR | Status: DC | PRN

## 2013-04-14 MED ORDER — ASPIRIN EC 81 MG PO TBEC: 81.0000 mg | DELAYED_RELEASE_TABLET | Freq: Every day | ORAL | Status: DC

## 2013-04-14 MED ORDER — ACETAMINOPHEN 325 MG PO TABS: 650.0000 mg | ORAL_TABLET | ORAL | Status: DC | PRN

## 2013-04-14 MED ORDER — HEPARIN SODIUM (PORCINE) 1000 UNIT/ML IJ SOLN
INTRAMUSCULAR | Status: DC | PRN
  Administered 2013-04-14: 5000 [IU] via INTRAVENOUS

## 2013-04-14 MED ORDER — LISINOPRIL 10 MG PO TABS
10.0000 mg | ORAL_TABLET | Freq: Every day | ORAL | Status: DC
  Filled 2013-04-14: qty 1

## 2013-04-14 MED ORDER — DEXMEDETOMIDINE HCL IN NACL 200 MCG/50ML IV SOLN
INTRAVENOUS | Status: DC | PRN
  Administered 2013-04-14: 0.6 ug/kg/h via INTRAVENOUS

## 2013-04-14 MED ORDER — LACTATED RINGERS IV SOLN
INTRAVENOUS | Status: DC | PRN
  Administered 2013-04-14: 07:00:00 via INTRAVENOUS

## 2013-04-14 MED ORDER — HYDROXYUREA 500 MG PO CAPS
ORAL_CAPSULE | ORAL | Status: AC
  Filled 2013-04-14: qty 1

## 2013-04-14 MED ORDER — METOPROLOL SUCCINATE ER 25 MG PO TB24
25.0000 mg | ORAL_TABLET | Freq: Every day | ORAL | Status: DC
  Filled 2013-04-14: qty 1

## 2013-04-14 MED ORDER — ONDANSETRON HCL 4 MG/2ML IJ SOLN
INTRAMUSCULAR | Status: DC | PRN
  Administered 2013-04-14: 4 mg via INTRAVENOUS

## 2013-04-14 MED ORDER — MIDAZOLAM HCL 5 MG/5ML IJ SOLN
INTRAMUSCULAR | Status: DC | PRN
  Administered 2013-04-14: 2 mg via INTRAVENOUS

## 2013-04-14 MED ORDER — ONDANSETRON HCL 4 MG/2ML IJ SOLN: 4.0000 mg | Freq: Four times a day (QID) | INTRAMUSCULAR | Status: DC | PRN

## 2013-04-14 MED ORDER — SODIUM CHLORIDE 0.9 % IJ SOLN: 3.0000 mL | Freq: Two times a day (BID) | INTRAMUSCULAR | Status: DC

## 2013-04-14 MED ORDER — PANTOPRAZOLE SODIUM 40 MG PO TBEC: 40.0000 mg | DELAYED_RELEASE_TABLET | Freq: Every day | ORAL | Status: DC

## 2013-04-14 MED ORDER — BUPIVACAINE HCL (PF) 0.25 % IJ SOLN
INTRAMUSCULAR | Status: AC
  Filled 2013-04-14: qty 30

## 2013-04-14 NOTE — Brief Op Note (Signed)
Perahisian PVC focus successfully ablated See dictation [phone]

## 2013-04-14 NOTE — Progress Notes (Signed)
Doing well s/p ablation.  PVC burden appears to be improved. Resume home medicines DC to home Follow-up with me in 4 weeks

## 2013-04-14 NOTE — Op Note (Signed)
NAMEMarland Kitchen  Anthony Sherman, Anthony Sherman NO.:  [id number]  MEDICAL RECORD NO.:  [id number]  LOCATION:  6C04C                        FACILITY:  [facility]  PHYSICIAN:  Hillis Range, MD       DATE OF BIRTH:  [birth year]  DATE OF PROCEDURE:  04/14/2013 DATE OF DISCHARGE:                              OPERATIVE REPORT   SURGEON:  Hillis Range, MD  PREPROCEDURE DIAGNOSIS:  Symptomatic premature ventricular contractions.  POSTPROCEDURE DIAGNOSIS:  Symptomatic premature ventricular contractions arising from the region of the His-Purkinje system.  PROCEDURES: 1. Comprehensive EP study. 2. Coronary sinus pacing and recording. 3. Left ventricular pacing and recording. 4. Three-dimensional mapping of ventricular tachycardia. 5. Radiofrequency ablation of ventricular tachycardia. 6. Arterial blood pressure monitoring.  INTRODUCTION:  Mr. Sadler is a pleasant 52 year old gentleman with a history of symptomatic and frequent premature ventricular contractions. He has failed medical therapy with beta-blockers, flecainide, diltiazem, and Rythmol.  He has found his PVCs to be quite debilitating.  He therefore presents today for EP study and radiofrequency ablation.  DESCRIPTION OF PROCEDURE:  Informed written consent was obtained and the patient was brought to the electrophysiology lab in the fasting state. He was adequately sedated with intravenous medications as outlined in the anesthesia report.  The patient's right groin was prepped and draped in the usual sterile fashion by the EP lab staff.  Using a percutaneous Seldinger technique, one 6, one 7, and one 8-French hemostasis sheaths were placed in the right common femoral vein.  An 8-French hemostasis sheath was placed in the right common femoral artery for blood pressure monitoring.  The patient presented to the electrophysiology lab in sinus rhythm with frequent premature ventricular contractions.  The PVC morphology was of a left  bundle-branch leftward axis.  There was abrupt transition between V1 and V2.  The QRS duration was 164 milliseconds.  A 7-French decapolar mapping catheter was introduced through the right common femoral vein and advanced into the coronary sinus for recording and pacing from this location.  A 6-French quadripolar Josephson catheter was introduced through the right common femoral vein and advanced into the right ventricle for recording and pacing.  This catheter was then pulled back to the His bundle location.  The patient as described above presented in sinus rhythm with frequent premature ventricular contractions.  The PR interval measured 155 milliseconds with a QRS duration of 121 milliseconds and a QT interval of 363 milliseconds.  The average RR interval measured 709 milliseconds.  The AH interval measured 75 milliseconds with an HV interval of 51 milliseconds.  Ventricular pacing was performed, which revealed VA dissociation when pacing at a cycle length of 600 milliseconds.  Rapid atrial pacing was performed, which revealed no evidence of PR greater than RR.  The AV Wenckebach cycle length was 340 milliseconds.  The decapolar catheter was pulled back into the right ventricle and used for three-dimensional mapping.  Three-dimensional mapping of the PVC focus was performed using the [employer].  This demonstrated that the earliest activation arose from the basal septal right ventricle just slightly anterior to the His electrogram.  I elected to perform additional mapping along the left ventricular outflow tract and  left ventricle.  Three-dimensional mapping was performed from the left ventricular outflow tract.  There were no early activations within this location.  Heparin was administered for adequate anticoagulation.  The mapping catheter was then placed into the left ventricle.  Additional mapping was performed.  This demonstrated that the earliest activation occurred  along the basal septal left ventricle, along the same location as was found along the right ventricular side of the septum.  The patient was therefore felt to have a septal PVC focus.  When pacing from the left ventricle pace map match was not very close to the presenting PVC.  Additionally, the unipolar electrogram did not reveal a QS electrogram and, therefore was suggestive that the PVC did not arise from the left ventricular side of the septum.  I therefore performed additional mapping along the right ventricular side of the septum.  A 7- [place] [employer] EZ [place] 4 mm ablation catheter was introduced in the right common femoral vein and advanced into the right ventricle. Additional mapping was performed extensively along the right ventricular septum.  This demonstrated that the PVC focus arose from a parahisian location and just slightly anterior (1 cm) anteriorly from the recorded His electrogram.  In this location with pace mapping, there was intermittent fusion of the His capture and a local myocardial capture. This was observed interestingly to reveal a 12/12 pace map match during fusion and loss of capture of the His bundle.  This suggested that the patient had a parahisian PVC focus and an anterior and slightly inferior location (1.4 cm) from the recorded His. The earliest activation proceeded the surface QRS during the PVC by 31 milliseconds.  A large QS electrogram was also observed during the PVC.  I therefore elected to perform very cautious ablation in this location.  A single radiofrequency current lesion was delivered with a target temperature of 50 degrees at 40 watts.  At 16 seconds of radiofrequency current delivered.  A PAC was observed which did not capture to the ventricle. Radiofrequency current was therefore discontinued abruptly.  The patient had intact AV conduction thereafter.  The patient was observed for 20 minutes without any further PVCs.  Due to  this parahisian location of the PVC focus, I felt that it was prudent to not perform additional Bonus radiofrequency ablation today.  Following ablation, the AH interval measured 77 milliseconds with an HV interval of 51 milliseconds.  Following ablation, rapid atrial pacing was performed, which revealed an AV Wenckebach cycle length of 360 milliseconds with no arrhythmias observed.  The patient had no further PVCs.  The procedure was therefore considered completed.  All catheters were removed and the sheaths were aspirated and flushed.  The sheaths were removed and hemostasis was assured.  There were no early apparent complications.  CONCLUSIONS: 1. Sinus rhythm with frequent left bundle-branch, leftward axis PVCs     upon presentation. 2. Mapping of the PVC focus revealed that it arose from a parahisian     location along the right ventricular side of the interventricular     septum. 3. Successful ablation of the PVC in this location. 4. No early apparent complications.     Hillis Range, MD     JA/MEDQ  D:  04/14/2013  T:  04/14/2013  Job:  [id number]

## 2013-04-14 NOTE — Anesthesia Preprocedure Evaluation (Addendum)
Anesthesia Evaluation  Patient identified by MRN, date of birth, ID band Patient awake    Reviewed: Allergy & Precautions, H&P , NPO status , Patient's Chart, lab work & pertinent test results, reviewed documented beta blocker date and time   Airway Mallampati: I TM Distance: >3 FB Neck ROM: Full    Dental no notable dental hx. (+) Teeth Intact and Dental Advisory Given   Pulmonary neg pulmonary ROS,  breath sounds clear to auscultation  Pulmonary exam normal       Cardiovascular hypertension, On Medications and On Home Beta Blockers + dysrhythmias Ventricular Tachycardia Rhythm:Regular Rate:Normal     Neuro/Psych negative neurological ROS  negative psych ROS   GI/Hepatic Neg liver ROS, GERD-  Medicated and Controlled,  Endo/Other  negative endocrine ROS  Renal/GU negative Renal ROS  negative genitourinary   Musculoskeletal   Abdominal   Peds  Hematology negative hematology ROS (+)   Anesthesia Other Findings   Reproductive/Obstetrics negative OB ROS                          Anesthesia Physical Anesthesia Plan  ASA: III  Anesthesia Plan: MAC   Post-op Pain Management:    Induction: Intravenous  Airway Management Planned: Simple Face Mask  Additional Equipment:   Intra-op Plan:   Post-operative Plan:   Informed Consent: I have reviewed the patients History and Physical, chart, labs and discussed the procedure including the risks, benefits and alternatives for the proposed anesthesia with the patient or authorized representative who has indicated his/her understanding and acceptance.   Dental advisory given  Plan Discussed with: CRNA  Anesthesia Plan Comments:        Anesthesia Quick Evaluation

## 2013-04-14 NOTE — Interval H&P Note (Signed)
History and Physical Interval Note:  04/14/2013 7:58 AM  Anthony Sherman  has presented today for surgery, with the diagnosis of VTach  The various methods of treatment have been discussed with the patient and family. After consideration of risks, benefits and other options for treatment, the patient has consented to  Procedure(s): VT Ablation (N/A) as a surgical intervention .  The patient's history has been reviewed, patient examined, no change in status, stable for surgery.  I have reviewed the patient's chart and labs.  Questions were answered to the patient's satisfaction.     Hillis Range

## 2013-04-14 NOTE — H&P (View-Only) (Signed)
Primary Care Physician: Selinda Flavin, MD Referring Physician: Simona Huh, MD   Anthony Sherman is a 52 y.o. male with a h/o non obstructive coronary disease by cath this year, hypertension, GERD, palpitations, and syncope.   He has been found to have PVC's.  Cardiac work up to date has included GXT, echo (EF 60-65% with mild MR and no RWMA), cath (no coronary disease), and labwork which have all been unremarkable. Recent event monitor demonstrated persistent frequent PVC's (bigeminal at times) despite Toprol XL 200mg  daily.  When I saw him last, we decreased toprol to 50mg  daily and added flecainide.  He did not tolerate flecainide due to SOB.  I therefore switched him to rhythmol which he reports has causes daily headaches.  He continues to have episodes of palpitations and symptoms of PVCs.    Past Medical History  Diagnosis Date  . Essential hypertension, benign   . Mixed hyperlipidemia   . GERD (gastroesophageal reflux disease)   . Syncope     2010   Past Surgical History  Procedure Laterality Date  . Cholecystectomy  ~ 2000  . Appendectomy  ~ 2000    Current Outpatient Prescriptions  Medication Sig Dispense Refill  . aspirin EC 81 MG tablet Take 81 mg by mouth daily.      . cetirizine (ZYRTEC) 10 MG tablet Take 10 mg by mouth daily as needed for allergies.      Marland Kitchen lisinopril (PRINIVIL,ZESTRIL) 10 MG tablet Take 10 mg by mouth daily.      . metoprolol succinate (TOPROL-XL) 50 MG 24 hr tablet Take 50 mg by mouth daily. TAKE 50MG  IN THE MORNING DAILY      . pantoprazole (PROTONIX) 40 MG tablet Take 40 mg by mouth daily.      . propafenone (RYTHMOL) 150 MG tablet Take 1 tablet (150 mg total) by mouth every 8 (eight) hours.  90 tablet  6  . rosuvastatin (CRESTOR) 5 MG tablet Take 5 mg by mouth daily.       No current facility-administered medications for this visit.    No Known Allergies  History   Social History  . Marital Status: Married    Spouse Name: N/A    Number  of Children: N/A  . Years of Education: N/A   Occupational History  . Not on file.   Social History Main Topics  . Smoking status: Never Smoker   . Smokeless tobacco: Never Used  . Alcohol Use: No  . Drug Use: No  . Sexual Activity: Yes   Other Topics Concern  . Not on file   Social History Narrative  . No narrative on file    Family History  Problem Relation Age of Onset  . CAD Mother   denies FH of sudden death or ventricular arrhythmia  ROS- All systems are reviewed and negative except as per the HPI above  Physical Exam: Filed Vitals:   04/08/13 1349  BP: 112/84  Height: 5\' 8"  (1.727 m)  Weight: 194 lb 9.6 oz (88.27 kg)    GEN- The patient is well appearing, alert and oriented x 3 today.   Head- normocephalic, atraumatic Eyes-  Sclera clear, conjunctiva pink Ears- hearing intact Oropharynx- clear Neck- supple, no JVP Lymph- no cervical lymphadenopathy Lungs- Clear to ausculation bilaterally, normal work of breathing Heart- Regular rate and rhythm with occasional ectopy, no murmurs, rubs or gallops, PMI not laterally displaced GI- soft, NT, ND, + BS Extremities- no clubbing, cyanosis, or edema MS-  no significant deformity or atrophy Skin- no rash or lesion Psych- euthymic mood, full affect Neuro- strength and sensation are intact  EKG today reveals sinus rhythm with PVCs (LBB L superior axis) Recent event monitor is reviewed which reveals sinus with PVCs and frequent bigeminy which correlates to symptoms,  No afib/NSVT/pauses/AV block Echo is reviewed Cath is reviewed  Assessment and Plan:  1. Symptomatic PVCs He has failed medical therapy with rhythmol, flecainide, and toprol.  Therapeutic strategies for PVCs including medicine and ablation were discussed in detail with the patient today. Risk, benefits, and alternatives to EP study and radiofrequency ablation were also discussed in detail today. These risks include but are not limited to stroke,  bleeding, vascular damage, tamponade, perforation, damage to the heart and other structures, AV block requiring pacemaker, worsening renal function, and death. The patient understands these risk and wishes to proceed.  We will therefore proceed with catheter ablation at the next available time.  2. HTN Stable No change required today

## 2013-04-14 NOTE — Anesthesia Postprocedure Evaluation (Signed)
  Anesthesia Post-op Note  Patient: Anthony Sherman  Procedure(s) Performed: Procedure(s): VT Ablation (N/A)  Patient Location: PACU and Cath Lab  Anesthesia Type:MAC  Level of Consciousness: awake, alert  and oriented  Airway and Oxygen Therapy: Patient Spontanous Breathing and Patient connected to nasal cannula oxygen  Post-op Pain: none  Post-op Assessment: Post-op Vital signs reviewed, Patient's Cardiovascular Status Stable, Respiratory Function Stable, Patent Airway and No signs of Nausea or vomiting  Post-op Vital Signs: Reviewed and stable  Complications: No apparent anesthesia complications

## 2013-04-14 NOTE — Transfer of Care (Signed)
Immediate Anesthesia Transfer of Care Note  Patient: Anthony Sherman  Procedure(s) Performed: Procedure(s): VT Ablation (N/A)  Patient Location: PACU  Anesthesia Type:MAC  Level of Consciousness: awake, alert  and oriented  Airway & Oxygen Therapy: Patient Spontanous Breathing  Post-op Assessment: Report given to PACU RN, Post -op Vital signs reviewed and stable and Patient moving all extremities  Post vital signs: Reviewed and stable  Complications: No apparent anesthesia complications

## 2013-05-08 ENCOUNTER — Ambulatory Visit (INDEPENDENT_AMBULATORY_CARE_PROVIDER_SITE_OTHER): Payer: PRIVATE HEALTH INSURANCE | Admitting: Internal Medicine

## 2013-05-08 ENCOUNTER — Encounter: Payer: Self-pay | Admitting: Internal Medicine

## 2013-05-08 ENCOUNTER — Encounter: Payer: Self-pay | Admitting: *Deleted

## 2013-05-08 VITALS — BP 125/87 | HR 84 | Ht 68.0 in | Wt 191.0 lb

## 2013-05-08 DIAGNOSIS — R0602 Shortness of breath: Secondary | ICD-10-CM

## 2013-05-08 DIAGNOSIS — I493 Ventricular premature depolarization: Secondary | ICD-10-CM

## 2013-05-08 DIAGNOSIS — I4949 Other premature depolarization: Secondary | ICD-10-CM

## 2013-05-08 DIAGNOSIS — R002 Palpitations: Secondary | ICD-10-CM

## 2013-05-08 DIAGNOSIS — R0609 Other forms of dyspnea: Secondary | ICD-10-CM

## 2013-05-08 NOTE — Progress Notes (Signed)
PCP:  Selinda Flavin, MD Primary Cardiologist:  Dr Diona Browner  The patient presents today for routine electrophysiology followup.  Since his recent PVC ablation, the patient reports doing better.  He continues to have PVCs and palpitations but feels that this has much improved since his ablation.  He continues to have exertional SOB/ fatigue.  He thinks that this could be due to PVCs but is not certain. Today, he denies symptoms of chest pain, orthopnea, PND, lower extremity edema, dizziness, presyncope, syncope, or neurologic sequela.  The patient feels that he is tolerating medications without difficulties and is otherwise without complaint today.   Past Medical History  Diagnosis Date  . Essential hypertension, benign   . Mixed hyperlipidemia   . GERD (gastroesophageal reflux disease)   . Syncope     2010  . Premature ventricular contractions    Past Surgical History  Procedure Laterality Date  . Cholecystectomy  ~ 2000  . Appendectomy  ~ 2000  . Pvc ablation  04/14/13    parahisian PVC focus ablated by Dr Johney Frame    Current Outpatient Prescriptions  Medication Sig Dispense Refill  . acetaminophen (TYLENOL) 500 MG tablet Take 1,000 mg by mouth every 6 (six) hours as needed for pain.      Marland Kitchen aspirin EC 81 MG tablet Take 81 mg by mouth daily.      . cetirizine (ZYRTEC) 10 MG tablet Take 10 mg by mouth daily as needed for allergies.      Marland Kitchen lisinopril (PRINIVIL,ZESTRIL) 10 MG tablet Take 10 mg by mouth at bedtime.       . metoprolol succinate (TOPROL-XL) 50 MG 24 hr tablet Take 50 mg by mouth at bedtime.       . naproxen (NAPROSYN) 250 MG tablet Take 250 mg by mouth daily as needed (headache).      . pantoprazole (PROTONIX) 40 MG tablet Take 40 mg by mouth at bedtime.       . rosuvastatin (CRESTOR) 5 MG tablet Take 5 mg by mouth daily.       No current facility-administered medications for this visit.    No Known Allergies  History   Social History  . Marital Status: Married   Spouse Name: N/A    Number of Children: N/A  . Years of Education: N/A   Occupational History  . Not on file.   Social History Main Topics  . Smoking status: Never Smoker   . Smokeless tobacco: Never Used  . Alcohol Use: No  . Drug Use: No  . Sexual Activity: Yes   Other Topics Concern  . Not on file   Social History Narrative  . No narrative on file    Family History  Problem Relation Age of Onset  . CAD Mother     Physical Exam: Filed Vitals:   05/08/13 0911  BP: 125/87  Pulse: 84  Height: 5\' 8"  (1.727 m)  Weight: 191 lb (86.637 kg)    GEN- The patient is well appearing, alert and oriented x 3 today.   Head- normocephalic, atraumatic Eyes-  Sclera clear, conjunctiva pink Ears- hearing intact Oropharynx- clear Neck- supple, no JVP Lymph- no cervical lymphadenopathy Lungs- Clear to ausculation bilaterally, normal work of breathing Heart- Regular rate and rhythm without ectopy today, no murmurs, rubs or gallops, PMI not laterally displaced GI- soft, NT, ND, + BS Extremities- no clubbing, cyanosis, or edema MS- no significant deformity or atrophy Skin- no rash or lesion Psych- euthymic mood, full affect Neuro-  strength and sensation are intact  ekg today reveals sinus rhythm 84 bpm, LAD, otherwise normal ekg  Assessment and Plan:  1. PVCs Improves post ablation Decrease toprol to 25mg  daily 48 hour holter to assess PVC burden post ablation  2. SOB/ DOE Unclear etiology Possibly due to PVCs Will repeat GXT to evaluate exertional PVC burden and also assess for ischemia Decrease toprol as above  Follow-up with me in 6 weeks

## 2013-05-08 NOTE — Patient Instructions (Signed)
Your physician has recommended that you wear a 48 hour event monitor. Event monitors are medical devices that record the heart's electrical activity. Doctors most often [place] these monitors to diagnose arrhythmias. Arrhythmias are problems with the speed or rhythm of the heartbeat. The monitor is a small, portable device. You can wear one while you do your normal daily activities. This is usually used to diagnose what is causing palpitations/syncope (passing out). Your physician has requested that you have an exercise tolerance test. For further information please visit [email or web address] Please also follow instruction sheet, as given. Continue all current medications. Office will contact with results via phone or letter.   Follow up in  6 weeks

## 2013-05-13 ENCOUNTER — Encounter: Payer: PRIVATE HEALTH INSURANCE | Admitting: Internal Medicine

## 2013-05-18 ENCOUNTER — Encounter: Payer: PRIVATE HEALTH INSURANCE | Admitting: Internal Medicine

## 2013-05-18 ENCOUNTER — Ambulatory Visit (HOSPITAL_COMMUNITY)
Admission: RE | Admit: 2013-05-18 | Discharge: 2013-05-18 | Disposition: A | Discharge status: Home / Self Care | Payer: PRIVATE HEALTH INSURANCE | Source: Ambulatory Visit | Attending: Internal Medicine | Admitting: Internal Medicine

## 2013-05-18 DIAGNOSIS — R002 Palpitations: Secondary | ICD-10-CM

## 2013-05-18 DIAGNOSIS — I493 Ventricular premature depolarization: Secondary | ICD-10-CM

## 2013-05-18 NOTE — Progress Notes (Signed)
48 hour Holter Monitor in progress. 

## 2013-05-22 ENCOUNTER — Other Ambulatory Visit: Payer: Self-pay | Admitting: *Deleted

## 2013-05-22 DIAGNOSIS — R002 Palpitations: Secondary | ICD-10-CM

## 2013-06-08 ENCOUNTER — Telehealth: Payer: Self-pay | Admitting: Internal Medicine

## 2013-06-08 NOTE — Telephone Encounter (Signed)
Patient informed to hold his Metoprolol Succ the night before as he takes his daily dose at night.  His Lisinopril is okay to take as usual.  Patient verbalized understanding.

## 2013-06-08 NOTE — Telephone Encounter (Signed)
Anthony Sherman is scheduled for stress testing on 06-15-13. States a letter was given to him With instructions but no instructions about his blood pressure medication. Please call Anthony Sherman and advise him about BP medications.

## 2013-06-15 ENCOUNTER — Ambulatory Visit (INDEPENDENT_AMBULATORY_CARE_PROVIDER_SITE_OTHER): Payer: PRIVATE HEALTH INSURANCE | Admitting: Internal Medicine

## 2013-06-15 DIAGNOSIS — R0602 Shortness of breath: Secondary | ICD-10-CM

## 2013-06-15 DIAGNOSIS — R0609 Other forms of dyspnea: Secondary | ICD-10-CM

## 2013-06-15 MED ORDER — VERAPAMIL HCL ER 120 MG PO TBCR: 120.0000 mg | EXTENDED_RELEASE_TABLET | Freq: Every day | ORAL | Status: DC

## 2013-06-15 NOTE — Patient Instructions (Signed)
Your physician has recommended you make the following change in your medication:  1) Start Verapamil 120mg  daily

## 2013-06-15 NOTE — Progress Notes (Signed)
Exercise Treadmill Test  Pre-Exercise Testing Evaluation Rhythm: normal sinus  Rate: 86     Test  Exercise Tolerance Test Ordering MD: Hillis Range, MD  Interpreting MD: Hillis Range, MD  Unique Test No: 1  Treadmill:  1  Indication for ETT: SOB  Contraindication to ETT: No   Stress Modality: exercise - treadmill  Cardiac Imaging Performed: non   Protocol: standard Bruce - maximal  Max BP:  219/99  Max MPHR (bpm):  169 85% MPR (bpm):  144  MPHR obtained (bpm):  187 % MPHR obtained:  111  Reached 85% MPHR (min:sec):  4:16 Total Exercise Time (min-sec):  8:08  Workload in METS:  10.1 Borg Scale: 20  Reason ETT Terminated:  dyspnea    ST Segment Analysis At Rest: normal ST segments - no evidence of significant ST depression With Exercise: non-specific ST changes  Other Information Arrhythmia:  No Angina during ETT:  absent (0) Quality of ETT:  diagnostic  ETT Interpretation:  normal - no evidence of ischemia by ST analysis  Comments: Occasional PVCs at rest which increased moderately during exercise.  Though he did have bigeminal PVCs, these events were sporadic and his predominant rhythm was sinus. At peak exercise, he did not have PVCs.  During recovery his PVC burden increased and was bigeminal at times.  No sustained ventricular arrhythmias  Recommendations: Resume metoprolol Continue to increase exercise tolerance with regular exercise Add verapamil 120mg  daily  Return as scheduled for EP follow-up

## 2013-07-03 ENCOUNTER — Ambulatory Visit (INDEPENDENT_AMBULATORY_CARE_PROVIDER_SITE_OTHER): Payer: PRIVATE HEALTH INSURANCE | Admitting: Internal Medicine

## 2013-07-03 ENCOUNTER — Encounter: Payer: Self-pay | Admitting: Internal Medicine

## 2013-07-03 VITALS — BP 152/98 | HR 65 | Ht 68.0 in | Wt 196.1 lb

## 2013-07-03 DIAGNOSIS — R0602 Shortness of breath: Secondary | ICD-10-CM

## 2013-07-03 DIAGNOSIS — R0609 Other forms of dyspnea: Secondary | ICD-10-CM

## 2013-07-03 LAB — BASIC METABOLIC PANEL
BUN: 15 mg/dL (ref 6–23)
Calcium: 10 mg/dL (ref 8.4–10.5)
GFR: 69.59 mL/min (ref >60.00)
Glucose, Bld: 93 mg/dL (ref 70–99)
Potassium: 4.8 mEq/L (ref 3.5–5.1)
Sodium: 137 mEq/L (ref 135–145)

## 2013-07-03 LAB — MAGNESIUM: Magnesium: 1.9 mg/dL (ref 1.5–2.5)

## 2013-07-03 MED ORDER — MAGNESIUM 400 MG PO CAPS: 400.0000 mg | ORAL_CAPSULE | Freq: Every day | ORAL | Status: DC

## 2013-07-03 MED ORDER — LISINOPRIL 20 MG PO TABS: 20.0000 mg | ORAL_TABLET | Freq: Every day | ORAL | Status: DC

## 2013-07-03 NOTE — Patient Instructions (Addendum)
Your physician has recommended you make the following change in your medication:  1) Stop Verpamil 2) Increase Lisinopril to 20 mg daily 3) Start Magnesium 400 mg daily  Your physician recommends that you return for lab work today: BMET/Mg  Keep your current follow up appointment with Dr. Johney Frame in January (08/10/2013 at 9:45am)

## 2013-07-03 NOTE — Progress Notes (Signed)
PCP:  Selinda Flavin, MD Primary Cardiologist:  Dr Diona Browner  The patient presents today for routine electrophysiology followup.  He continues to have PVCs intermittently.  He has not tolerated verapamil due to headaches but has not found this to be helpful either. Today, he denies symptoms of chest pain, orthopnea, PND, lower extremity edema, dizziness, presyncope, syncope, or neurologic sequela.  The patient feels that he is tolerating medications without difficulties and is otherwise without complaint today.   Past Medical History  Diagnosis Date  . Essential hypertension, benign   . Mixed hyperlipidemia   . GERD (gastroesophageal reflux disease)   . Syncope     2010  . Premature ventricular contractions    Past Surgical History  Procedure Laterality Date  . Cholecystectomy  ~ 2000  . Appendectomy  ~ 2000  . Pvc ablation  04/14/13    parahisian PVC focus ablated by Dr Johney Frame    Current Outpatient Prescriptions  Medication Sig Dispense Refill  . acetaminophen (TYLENOL) 500 MG tablet Take 1,000 mg by mouth every 6 (six) hours as needed for pain.      Marland Kitchen aspirin EC 81 MG tablet Take 81 mg by mouth daily.      . cetirizine (ZYRTEC) 10 MG tablet Take 10 mg by mouth daily as needed for allergies.      Marland Kitchen lisinopril (PRINIVIL,ZESTRIL) 10 MG tablet Take 10 mg by mouth at bedtime.       . metoprolol succinate (TOPROL-XL) 50 MG 24 hr tablet Take 50 mg by mouth at bedtime.       . naproxen (NAPROSYN) 250 MG tablet Take 250 mg by mouth daily as needed (headache).      . pantoprazole (PROTONIX) 40 MG tablet Take 40 mg by mouth at bedtime.       . rosuvastatin (CRESTOR) 5 MG tablet Take 5 mg by mouth daily.      . verapamil (CALAN-SR) 120 MG CR tablet Take 1 tablet (120 mg total) by mouth at bedtime.  90 tablet  3   No current facility-administered medications for this visit.    No Known Allergies  History   Social History  . Marital Status: Married    Spouse Name: N/A    Number of  Children: N/A  . Years of Education: N/A   Occupational History  . Not on file.   Social History Main Topics  . Smoking status: Never Smoker   . Smokeless tobacco: Never Used  . Alcohol Use: No  . Drug Use: No  . Sexual Activity: Yes   Other Topics Concern  . Not on file   Social History Narrative  . No narrative on file    Family History  Problem Relation Age of Onset  . CAD Mother     Physical Exam: Filed Vitals:   07/03/13 0926  BP: 152/98  Pulse: 65  Height: 5\' 8"  (1.727 m)  Weight: 196 lb 1.9 oz (88.959 kg)    GEN- The patient is well appearing, alert and oriented x 3 today.   Head- normocephalic, atraumatic Eyes-  Sclera clear, conjunctiva pink Ears- hearing intact Oropharynx- clear Neck- supple, no JVP Lymph- no cervical lymphadenopathy Lungs- Clear to ausculation bilaterally, normal work of breathing Heart- Regular rate and rhythm without ectopy today, no murmurs, rubs or gallops, PMI not laterally displaced GI- soft, NT, ND, + BS Extremities- no clubbing, cyanosis, or edema MS- no significant deformity or atrophy Skin- no rash or lesion Psych- euthymic mood, full affect Neuro-  strength and sensation are intact  ekg today reveals sinus rhythm 84 bpm, LAD, otherwise normal ekg  Assessment and Plan:  1. PVCs Increase lisinopril for better BP control (which should help reduce PVCs) Add magnesium supplementation Bmet, mg today Consider multaq as our next option His PVCs in the office are quite infrequent.  I do not feel that additional ablation would be helpful  2. HTN Above goal Increase lisinopril bmet today  Follow-up with me in 4-5 weeks as scheduled in [place]

## 2013-07-09 ENCOUNTER — Ambulatory Visit: Payer: PRIVATE HEALTH INSURANCE | Admitting: Internal Medicine

## 2013-08-10 ENCOUNTER — Encounter: Payer: Self-pay | Admitting: Internal Medicine

## 2013-08-10 ENCOUNTER — Ambulatory Visit (INDEPENDENT_AMBULATORY_CARE_PROVIDER_SITE_OTHER): Payer: PRIVATE HEALTH INSURANCE | Admitting: Internal Medicine

## 2013-08-10 VITALS — BP 137/94 | HR 74 | Ht 68.0 in | Wt 196.0 lb

## 2013-08-10 DIAGNOSIS — I4949 Other premature depolarization: Secondary | ICD-10-CM

## 2013-08-10 DIAGNOSIS — I10 Essential (primary) hypertension: Secondary | ICD-10-CM

## 2013-08-10 DIAGNOSIS — I493 Ventricular premature depolarization: Secondary | ICD-10-CM

## 2013-08-10 MED ORDER — DRONEDARONE HCL 400 MG PO TABS: 400.0000 mg | ORAL_TABLET | Freq: Two times a day (BID) | ORAL | Status: DC

## 2013-08-10 MED ORDER — HYDROCHLOROTHIAZIDE 12.5 MG PO CAPS: 12.5000 mg | ORAL_CAPSULE | Freq: Every day | ORAL | Status: DC

## 2013-08-10 NOTE — Patient Instructions (Signed)
   Begin HCTZ 12.5mg  daily - new sent to pharm  If tolerating the HCTZ, can begin Multaq 400mg  twice a day after being on HCTZ x 2 weeks - new sent to pharm Continue all other medications.   Follow up in  6 weeks - Dr. Rayann Heman

## 2013-08-10 NOTE — Progress Notes (Signed)
PCP:  Rory Percy, MD Primary Cardiologist:  Dr Domenic Polite  The patient presents today for routine electrophysiology followup.  He continues to have PVCs intermittently. His blood pressure is improving and his pvcs may be a little better with this.  Today, he denies symptoms of chest pain, orthopnea, PND, lower extremity edema, dizziness, presyncope, syncope, or neurologic sequela.  The patient feels that he is tolerating medications without difficulties and is otherwise without complaint today.   Past Medical History  Diagnosis Date  . Essential hypertension, benign   . Mixed hyperlipidemia   . GERD (gastroesophageal reflux disease)   . Syncope     2010  . Premature ventricular contractions    Past Surgical History  Procedure Laterality Date  . Cholecystectomy  ~ 2000  . Appendectomy  ~ 2000  . Pvc ablation  04/14/13    parahisian PVC focus ablated by Dr Rayann Heman    Current Outpatient Prescriptions  Medication Sig Dispense Refill  . acetaminophen (TYLENOL) 500 MG tablet Take 1,000 mg by mouth every 6 (six) hours as needed for pain.      Marland Kitchen aspirin EC 81 MG tablet Take 81 mg by mouth daily.      . cetirizine (ZYRTEC) 10 MG tablet Take 10 mg by mouth daily as needed for allergies.      Marland Kitchen lisinopril (PRINIVIL,ZESTRIL) 20 MG tablet Take 1 tablet (20 mg total) by mouth at bedtime.  30 tablet  11  . metoprolol succinate (TOPROL-XL) 50 MG 24 hr tablet Take 50 mg by mouth at bedtime.       . naproxen (NAPROSYN) 250 MG tablet Take 250 mg by mouth daily as needed (headache).      . pantoprazole (PROTONIX) 40 MG tablet Take 40 mg by mouth at bedtime.       . rosuvastatin (CRESTOR) 5 MG tablet Take 5 mg by mouth daily.      Marland Kitchen dronedarone (MULTAQ) 400 MG tablet Take 1 tablet (400 mg total) by mouth 2 (two) times daily with a meal.  60 tablet  6  . hydrochlorothiazide (MICROZIDE) 12.5 MG capsule Take 1 capsule (12.5 mg total) by mouth daily.  30 capsule  6   No current facility-administered  medications for this visit.    No Known Allergies  History   Social History  . Marital Status: Married    Spouse Name: N/A    Number of Children: N/A  . Years of Education: N/A   Occupational History  . Not on file.   Social History Main Topics  . Smoking status: Never Smoker   . Smokeless tobacco: Never Used  . Alcohol Use: No  . Drug Use: No  . Sexual Activity: Yes   Other Topics Concern  . Not on file   Social History Narrative  . No narrative on file    Family History  Problem Relation Age of Onset  . CAD Mother     Physical Exam: Filed Vitals:   08/10/13 0939  BP: 137/94  Pulse: 74  Height: 5\' 8"  (1.727 m)  Weight: 196 lb (88.905 kg)  SpO2: 97%    GEN- The patient is well appearing, alert and oriented x 3 today.   Head- normocephalic, atraumatic Eyes-  Sclera clear, conjunctiva pink Ears- hearing intact Oropharynx- clear Neck- supple, no JVP Lymph- no cervical lymphadenopathy Lungs- Clear to ausculation bilaterally, normal work of breathing Heart- Regular rate and rhythm without ectopy today, no murmurs, rubs or gallops, PMI not laterally displaced GI- soft,  NT, ND, + BS Extremities- no clubbing, cyanosis, or edema   Assessment and Plan:  1. PVCs Add hctz 12.5mg  daily for better blood pressure control which may help with pvcs If he tolerates hctz then add multaq 400mg  BID in 2 weeks.  We are running out of options for his PVCs. His PVCs in the office are quite infrequent.  I do not feel that additional ablation would be helpful  2. HTN Above goal Add hctz as above bmet upon return  Follow-up with me in 6 weeks

## 2013-08-27 ENCOUNTER — Telehealth: Payer: Self-pay | Admitting: Internal Medicine

## 2013-08-27 DIAGNOSIS — I493 Ventricular premature depolarization: Secondary | ICD-10-CM

## 2013-08-27 NOTE — Telephone Encounter (Signed)
New Message  Pt wife called states that the pt's heart has been continuously racing.Marland Kitchen SOB whenever he moves.. while at rest and with exertion// Please call back to discuss.

## 2013-08-27 NOTE — Telephone Encounter (Signed)
Spoke with wife, this is not continuous, it is intermittent.  He is still taking the HCTZ and has not yet started the Multaq but is going to do so today.  She wanted to be with him when he started.  She feels some of this could be due to dehydration.  Discussed with Dr Rayann Heman  Patient will need to obtain a BMP and Liver panel today.  I will put order in and they will go by the [facility] office to pick up

## 2013-09-17 ENCOUNTER — Other Ambulatory Visit: Payer: Self-pay | Admitting: *Deleted

## 2013-09-17 DIAGNOSIS — I493 Ventricular premature depolarization: Secondary | ICD-10-CM

## 2013-09-21 ENCOUNTER — Ambulatory Visit (INDEPENDENT_AMBULATORY_CARE_PROVIDER_SITE_OTHER): Payer: PRIVATE HEALTH INSURANCE | Admitting: Internal Medicine

## 2013-09-21 ENCOUNTER — Encounter: Payer: Self-pay | Admitting: Internal Medicine

## 2013-09-21 VITALS — BP 150/92 | HR 72 | Ht 68.0 in | Wt 198.1 lb

## 2013-09-21 DIAGNOSIS — I493 Ventricular premature depolarization: Secondary | ICD-10-CM

## 2013-09-21 DIAGNOSIS — R0989 Other specified symptoms and signs involving the circulatory and respiratory systems: Secondary | ICD-10-CM

## 2013-09-21 DIAGNOSIS — R002 Palpitations: Secondary | ICD-10-CM

## 2013-09-21 DIAGNOSIS — I10 Essential (primary) hypertension: Secondary | ICD-10-CM

## 2013-09-21 DIAGNOSIS — R0609 Other forms of dyspnea: Secondary | ICD-10-CM

## 2013-09-21 DIAGNOSIS — I4949 Other premature depolarization: Secondary | ICD-10-CM

## 2013-09-21 MED ORDER — HYDROCHLOROTHIAZIDE 25 MG PO TABS: 25.0000 mg | ORAL_TABLET | Freq: Every day | ORAL | Status: DC

## 2013-09-21 NOTE — Progress Notes (Signed)
PCP:  Rory Percy, MD Primary Cardiologist:  Dr Domenic Polite  The patient presents today for routine electrophysiology followup.  He continues to have PVCs intermittently. His blood pressure is improving and his pvcs may be a little better with this.  He did not tolerate multaq due to "nightmares".  Today, he denies symptoms of chest pain, orthopnea, PND, lower extremity edema, dizziness, presyncope, syncope, or neurologic sequela.  The patient feels that he is tolerating medications without difficulties and is otherwise without complaint today.   Past Medical History  Diagnosis Date  . Essential hypertension, benign   . Mixed hyperlipidemia   . GERD (gastroesophageal reflux disease)   . Syncope     2010  . Premature ventricular contractions    Past Surgical History  Procedure Laterality Date  . Cholecystectomy  ~ 2000  . Appendectomy  ~ 2000  . Pvc ablation  04/14/13    parahisian PVC focus ablated by Dr Rayann Heman    Current Outpatient Prescriptions  Medication Sig Dispense Refill  . acetaminophen (TYLENOL) 500 MG tablet Take 1,000 mg by mouth every 6 (six) hours as needed for pain.      Marland Kitchen aspirin EC 81 MG tablet Take 81 mg by mouth daily.      . cetirizine (ZYRTEC) 10 MG tablet Take 10 mg by mouth daily as needed for allergies.      Marland Kitchen lisinopril (PRINIVIL,ZESTRIL) 20 MG tablet Take 1 tablet (20 mg total) by mouth at bedtime.  30 tablet  11  . metoprolol succinate (TOPROL-XL) 50 MG 24 hr tablet Take 50 mg by mouth at bedtime.       . naproxen (NAPROSYN) 250 MG tablet Take 250 mg by mouth daily as needed (headache).      . pantoprazole (PROTONIX) 40 MG tablet Take 40 mg by mouth at bedtime.       . rosuvastatin (CRESTOR) 5 MG tablet Take 5 mg by mouth daily.      . hydrochlorothiazide (HYDRODIURIL) 25 MG tablet Take 1 tablet (25 mg total) by mouth daily.  90 tablet  6   No current facility-administered medications for this visit.    No Known Allergies  History   Social History  .  Marital Status: Married    Spouse Name: N/A    Number of Children: N/A  . Years of Education: N/A   Occupational History  . Not on file.   Social History Main Topics  . Smoking status: Never Smoker   . Smokeless tobacco: Never Used  . Alcohol Use: No  . Drug Use: No  . Sexual Activity: Yes   Other Topics Concern  . Not on file   Social History Narrative  . No narrative on file    Family History  Problem Relation Age of Onset  . CAD Mother     Physical Exam: Filed Vitals:   09/21/13 1045  BP: 150/92  Pulse: 72  Height: 5\' 8"  (1.727 m)  Weight: 198 lb 1.9 oz (89.867 kg)    GEN- The patient is well appearing, alert and oriented x 3 today.   Head- normocephalic, atraumatic Eyes-  Sclera clear, conjunctiva pink Ears- hearing intact Oropharynx- clear Neck- supple, no JVP Lymph- no cervical lymphadenopathy Lungs- Clear to ausculation bilaterally, normal work of breathing Heart- Regular rate and rhythm without ectopy today, no murmurs, rubs or gallops, PMI not laterally displaced GI- soft, NT, ND, + BS Extremities- no clubbing, cyanosis, or edema  ekg today reveals sinus rhythm 72 bpm, otherwise  normal ekg, no pvcs  Assessment and Plan:  1. PVCs Increase hctz to 25mg  daily for better blood pressure control which may help with pvcs Unfortunately, he has failed medical therapy with flecainide, rhythmol, and multaq.  Higher doses of metoprolol and also CCBs were ineffective.  His PVC burden has decreased with ablation of his PVC focus near the region of the his bundle.  I would be reluctant to perform additional ablation in that region, though he remains highly symptomatic with his PVCs. I really am at a loss for what to do with this gentleman.  We had a long discussion today regarding his options.  I think at this time, I will refer him to Dr Remus Blake at [facility].  It may be that an additional attempt at ablation may be helpful.  Dr Remus Blake would have cryo as an option if he is  found to have recurrent PVCs from near the his bundle.  2. HTN Above goal Increase hctz as above bmet upon return  Follow-up after seen by Dr Remus Blake

## 2013-09-21 NOTE — Patient Instructions (Signed)
Your physician has referred you to Dr. Gwynneth Munson with [facility].  Your physician has recommended you make the following change in your medication:  Increase Hydrochlorothiazide 25 MG take one tablet by mouth once daily.  Continue all other medications the same.

## 2013-09-29 ENCOUNTER — Telehealth: Payer: Self-pay | Admitting: Internal Medicine

## 2013-09-29 NOTE — Telephone Encounter (Signed)
NPI # [id number]  For Dr. Stephenie Acres

## 2013-09-29 NOTE — Telephone Encounter (Signed)
Appointment was made with Dr. Stephenie Acres at [facility] per Dr. Rayann Heman for Wednesday, April 29th, 2015 For 1:00pm.  Dr. Lilli Few office is putting [name] on the wait list also. Information will be mailed To patient in reference to this appointment. Direct # [phone]. Notes will be submitted to [phone] Attention Kim.  [facility] [place], [place], [place]   [id number]

## 2014-07-15 ENCOUNTER — Encounter (HOSPITAL_COMMUNITY): Payer: Self-pay | Admitting: Cardiology

## 2014-07-19 ENCOUNTER — Other Ambulatory Visit: Payer: Self-pay | Admitting: Internal Medicine

## 2016-04-04 ENCOUNTER — Encounter: Payer: Self-pay | Admitting: Internal Medicine

## 2016-04-04 ENCOUNTER — Ambulatory Visit (INDEPENDENT_AMBULATORY_CARE_PROVIDER_SITE_OTHER): Payer: BLUE CROSS/BLUE SHIELD | Admitting: Internal Medicine

## 2016-04-04 VITALS — BP 138/70 | HR 78 | Ht 68.0 in | Wt 212.0 lb

## 2016-04-04 DIAGNOSIS — I10 Essential (primary) hypertension: Secondary | ICD-10-CM

## 2016-04-04 DIAGNOSIS — I493 Ventricular premature depolarization: Secondary | ICD-10-CM | POA: Diagnosis not present

## 2016-04-04 DIAGNOSIS — R079 Chest pain, unspecified: Secondary | ICD-10-CM

## 2016-04-04 MED ORDER — SOTALOL HCL 80 MG PO TABS: 80.0000 mg | ORAL_TABLET | Freq: Two times a day (BID) | ORAL | 6 refills | Status: DC

## 2016-04-04 MED ORDER — METOPROLOL SUCCINATE ER 50 MG PO TB24: 50.0000 mg | ORAL_TABLET | Freq: Every day | ORAL | Status: DC

## 2016-04-04 NOTE — Patient Instructions (Addendum)
Medication Instructions:   Stop Metoprolol on Sunday, 04/08/2016.  Begin Sotalol 80mg  twice a day on Monday, 04/09/2016.  Continue all other medications.    Labwork:  BMET, Magnesium - orders given today.   Office will contact with results via phone or letter.    Testing/Procedures:  none  Follow-Up:  2-4 weeks    Any Other Special Instructions Will Be Listed Below (If Applicable).  Nurse visit next Wednesday, 04/11/2016 for EKG.    If you need a refill on your cardiac medications before your next appointment, please call your pharmacy.

## 2016-04-04 NOTE — Progress Notes (Signed)
PCP:  Rory Percy, MD Primary Cardiologist:  Dr Domenic Polite  The patient presents today for electrophysiology followup.  He continues to have PVCs intermittently.  He underwent ablation at [facility] for PVCs arising near the His and had transient heart block which limited therapy.  He became dissatisfied with outpatient management and then went to [facility] where he was seen by Dr Barbaraann Barthel.  He wore event monitor and had an ett.  Discussions of sotalol were had.  Unfortunately, Dr Barbaraann Barthel left [facility] and the patient has not been given follow-up.  He therefore returns today to see me.   Today, he denies symptoms of chest pain, orthopnea, PND, lower extremity edema, dizziness, presyncope, syncope, or neurologic sequela.  The patient feels that he is tolerating medications without difficulties and is otherwise without complaint today.   Past Medical History:  Diagnosis Date  . Essential hypertension, benign   . GERD (gastroesophageal reflux disease)   . Mixed hyperlipidemia   . Premature ventricular contractions   . Syncope    2010   Past Surgical History:  Procedure Laterality Date  . APPENDECTOMY  ~ 2000  . CHOLECYSTECTOMY  ~ 2000  . LEFT HEART CATHETERIZATION WITH CORONARY ANGIOGRAM N/A 01/07/2013   Procedure: LEFT HEART CATHETERIZATION WITH CORONARY ANGIOGRAM;  Surgeon: Minus Breeding, MD;  Location: [facility] CATH LAB;  Service: Cardiovascular;  Laterality: N/A;  . PVC ablation  04/14/13   parahisian PVC focus ablated by Dr Rayann Heman  . SUPRAVENTRICULAR TACHYCARDIA ABLATION N/A 04/14/2013   Procedure: VT Ablation;  Surgeon: Thompson Grayer, MD;  Location: [facility] CATH LAB;  Service: Cardiovascular;  Laterality: N/A;    Current Outpatient Prescriptions  Medication Sig Dispense Refill  . acetaminophen (TYLENOL) 500 MG tablet Take 1,000 mg by mouth every 6 (six) hours as needed for pain.    Marland Kitchen aspirin EC 81 MG tablet Take 81 mg by mouth daily.    . cetirizine (ZYRTEC) 10 MG tablet Take 10 mg by mouth daily as needed for  allergies.    . hydrochlorothiazide (HYDRODIURIL) 25 MG tablet Take 1 tablet (25 mg total) by mouth daily. 90 tablet 6  . lisinopril (PRINIVIL,ZESTRIL) 20 MG tablet TAKE ONE TABLET BY MOUTH AT BEDTIME 30 tablet 2  . metoprolol succinate (TOPROL-XL) 50 MG 24 hr tablet Take 1 tablet (50 mg total) by mouth at bedtime.    . naproxen (NAPROSYN) 250 MG tablet Take 250 mg by mouth daily as needed (headache).    . pantoprazole (PROTONIX) 40 MG tablet Take 40 mg by mouth at bedtime.     . rosuvastatin (CRESTOR) 5 MG tablet Take 5 mg by mouth daily.    . sotalol (BETAPACE) 80 MG tablet Take 1 tablet (80 mg total) by mouth 2 (two) times daily. 60 tablet 6   No current facility-administered medications for this visit.     Allergies  Allergen Reactions  . Flecainide     SOB 2014    Social History   Social History  . Marital status: Married    Spouse name: N/A  . Number of children: N/A  . Years of education: N/A   Occupational History  . Not on file.   Social History Main Topics  . Smoking status: Never Smoker  . Smokeless tobacco: Never Used  . Alcohol use No  . Drug use: No  . Sexual activity: Yes   Other Topics Concern  . Not on file   Social History Narrative  . No narrative on file  Family History  Problem Relation Age of Onset  . CAD Mother     Physical Exam: Vitals:   04/04/16 1322  BP: 138/70  Pulse: 78  SpO2: 98%  Weight: 212 lb (96.2 kg)  Height: 5\' 8"  (1.727 m)    GEN- The patient is well appearing, alert and oriented x 3 today.   Head- normocephalic, atraumatic Eyes-  Sclera clear, conjunctiva pink Ears- hearing intact Oropharynx- clear Neck- supple  Lungs- Clear to ausculation bilaterally, normal work of breathing Heart- Regular rate and rhythm without ectopy today, no murmurs, rubs or gallops, PMI not laterally displaced GI- soft, NT, ND, + BS Extremities- no clubbing, cyanosis, or edema  ekg today reveals sinus rhythm 85 bpm, PVCs are of a  narrow QRS with LBBB and transition at V1-V2.  L superior axis.  Assessment and Plan:  1. PVCs Due to location near His, ablation has been difficult.  I had a long discussion with patient today.  Will stop metoprolol and start sotalol 80mg  BID.  He will start this on Monday and then return on Wed for an ekg to evaluate the QTc.  I will see him in 2-4 weeks to assess response.  Could consider repeat ablation if episodes are not improved.  2. HTN Stable No change required today  Very complicated case.  Records from [facility] and [facility] were reviewed in care everywhere today.  He has seen 2 other EPs for opinions.  A high level of decision making was required for this encounter.  Thompson Grayer MD, [facility] 04/04/2016 5:46 PM

## 2016-04-11 ENCOUNTER — Ambulatory Visit (INDEPENDENT_AMBULATORY_CARE_PROVIDER_SITE_OTHER): Payer: BLUE CROSS/BLUE SHIELD | Admitting: *Deleted

## 2016-04-11 DIAGNOSIS — I493 Ventricular premature depolarization: Secondary | ICD-10-CM | POA: Diagnosis not present

## 2016-04-11 NOTE — Progress Notes (Signed)
Patient in office for EKG this morning.  Stated he doesn't really feel much difference on the Sotalol.  EKG scanned into EPIC & sent to Dr. Rayann Heman for review.

## 2016-04-17 ENCOUNTER — Telehealth: Payer: Self-pay | Admitting: *Deleted

## 2016-04-17 NOTE — Telephone Encounter (Signed)
Notes Recorded by Laurine Blazer, LPN on [id number] at 075-GRM AM EDT Patient notified. Copy to pmd. Follow up with Dr. Rayann Heman tomorrow. ------  Notes Recorded by Thompson Grayer, MD on 04/16/2016 at 1:52 PM EDT Results reviewed. Madaline Savage, please inform pt of result. I will route to primary care also.

## 2016-04-18 ENCOUNTER — Ambulatory Visit (INDEPENDENT_AMBULATORY_CARE_PROVIDER_SITE_OTHER): Payer: BLUE CROSS/BLUE SHIELD | Admitting: Internal Medicine

## 2016-04-18 ENCOUNTER — Encounter: Payer: Self-pay | Admitting: Neurology

## 2016-04-18 ENCOUNTER — Ambulatory Visit (INDEPENDENT_AMBULATORY_CARE_PROVIDER_SITE_OTHER): Payer: BLUE CROSS/BLUE SHIELD | Admitting: Neurology

## 2016-04-18 VITALS — BP 126/82 | HR 83 | Ht 68.0 in | Wt 202.8 lb

## 2016-04-18 DIAGNOSIS — I10 Essential (primary) hypertension: Secondary | ICD-10-CM

## 2016-04-18 DIAGNOSIS — I493 Ventricular premature depolarization: Secondary | ICD-10-CM | POA: Diagnosis not present

## 2016-04-18 DIAGNOSIS — M797 Fibromyalgia: Secondary | ICD-10-CM | POA: Diagnosis not present

## 2016-04-18 MED ORDER — VERAPAMIL HCL ER 120 MG PO CP24: 120.0000 mg | ORAL_CAPSULE | Freq: Every day | ORAL | 3 refills | Status: DC

## 2016-04-18 MED ORDER — GABAPENTIN 300 MG PO CAPS: 300.0000 mg | ORAL_CAPSULE | Freq: Two times a day (BID) | ORAL | 3 refills | Status: DC

## 2016-04-18 NOTE — Patient Instructions (Addendum)
   Neurontin (gabapentin) may result in drowsiness, ankle swelling, gait instability, or possibly dizziness. Please contact our office if significant side effects occur with this medication.  Musculoskeletal Pain Musculoskeletal pain is muscle and boney aches and pains. These pains can occur in any part of the body. Your caregiver may treat you without knowing the cause of the pain. They may treat you if blood or urine tests, X-rays, and other tests were normal.  CAUSES There is often not a definite cause or reason for these pains. These pains may be caused by a type of germ (virus). The discomfort may also come from overuse. Overuse includes working out too hard when your body is not fit. Boney aches also come from weather changes. Bone is sensitive to atmospheric pressure changes. HOME CARE INSTRUCTIONS   Ask when your test results will be ready. Make sure you get your test results.  Only take over-the-counter or prescription medicines for pain, discomfort, or fever as directed by your caregiver. If you were given medications for your condition, do not drive, operate machinery or power tools, or sign legal documents for 24 hours. Do not drink alcohol. Do not take sleeping pills or other medications that may interfere with treatment.  Continue all activities unless the activities cause more pain. When the pain lessens, slowly resume normal activities. Gradually increase the intensity and duration of the activities or exercise.  During periods of severe pain, bed rest may be helpful. Lay or sit in any position that is comfortable.  Putting ice on the injured area.  Put ice in a bag.  Place a towel between your skin and the bag.  Leave the ice on for 15 to 20 minutes, 3 to 4 times a day.  Follow up with your caregiver for continued problems and no reason can be found for the pain. If the pain becomes worse or does not go away, it may be necessary to repeat tests or do additional testing. Your  caregiver may need to look further for a possible cause. SEEK IMMEDIATE MEDICAL CARE IF:  You have pain that is getting worse and is not relieved by medications.  You develop chest pain that is associated with shortness or breath, sweating, feeling sick to your stomach (nauseous), or throw up (vomit).  Your pain becomes localized to the abdomen.  You develop any new symptoms that seem different or that concern you. MAKE SURE YOU:   Understand these instructions.  Will watch your condition.  Will get help right away if you are not doing well or get worse.   This information is not intended to replace advice given to you by your health care provider. Make sure you discuss any questions you have with your health care provider.   Document Released: 07/23/2005 Document Revised: 10/15/2011 Document Reviewed: 03/27/2013 Elsevier Interactive Patient Education [employer].

## 2016-04-18 NOTE — Patient Instructions (Signed)
Medication Instructions:  Your physician has recommended you make the following change in your medication:   1) STOP Sotalol 2) START Verapamil 120 mg daily on 04/21/16. Please call back after 05/01/16 if not better, and will increase medication to 2 tablets (240 mg) daily  Labwork: None Ordered   Testing/Procedures: None Ordered   Follow-Up: Your physician recommends that you schedule a follow-up appointment in: 3-4 weeks with Dr. Rayann Heman.     Any Other Special Instructions Will Be Listed Below (If Applicable).     If you need a refill on your cardiac medications before your next appointment, please call your pharmacy.

## 2016-04-18 NOTE — Progress Notes (Signed)
PCP:  Rory Percy, MD Primary Cardiologist:  Dr Domenic Polite  The patient presents today for electrophysiology followup.  He continues to have PVCs intermittently.  He underwent ablation at [facility] for PVCs arising near the His and had transient heart block which limited therapy.  He became dissatisfied with outpatient management and then went to [facility] where he was seen by Dr Barbaraann Barthel.  He wore event monitor and had an ett.  Discussions of sotalol were had.  Unfortunately, Dr Barbaraann Barthel left [facility] and the patient has not been given follow-up.  I saw him several weeks ago and started sotalol.  His PVCs have not improved.  He reports symptoms of headaches which occur every 4 hours after a dose of sotalol.   Today, he denies symptoms of chest pain, orthopnea, PND, lower extremity edema, dizziness, presyncope, syncope, or neurologic sequela.  The patient feels that he is tolerating medications without difficulties and is otherwise without complaint today.   Past Medical History:  Diagnosis Date  . Essential hypertension, benign   . Fibromyalgia   . GERD (gastroesophageal reflux disease)   . Mixed hyperlipidemia   . POTS (postural orthostatic tachycardia syndrome)   . Premature ventricular contractions   . Syncope    2010   Past Surgical History:  Procedure Laterality Date  . APPENDECTOMY  ~ 2000  . CHOLECYSTECTOMY  ~ 2000  . LEFT HEART CATHETERIZATION WITH CORONARY ANGIOGRAM N/A 01/07/2013   Procedure: LEFT HEART CATHETERIZATION WITH CORONARY ANGIOGRAM;  Surgeon: Minus Breeding, MD;  Location: [facility] CATH LAB;  Service: Cardiovascular;  Laterality: N/A;  . PVC ablation  04/14/13   parahisian PVC focus ablated by Dr Rayann Heman  . SUPRAVENTRICULAR TACHYCARDIA ABLATION N/A 04/14/2013   Procedure: VT Ablation;  Surgeon: Thompson Grayer, MD;  Location: [facility] CATH LAB;  Service: Cardiovascular;  Laterality: N/A;    Current Outpatient Prescriptions  Medication Sig Dispense Refill  . acetaminophen (TYLENOL) 500 MG tablet  Take 2 tablets by mouth every 8-12 hours as needed for pain/headache    . aspirin EC 81 MG tablet Take 81 mg by mouth daily.    . cetirizine (ZYRTEC) 10 MG tablet Take 10 mg by mouth daily as needed for allergies.    . hydrochlorothiazide (HYDRODIURIL) 25 MG tablet Take 1 tablet (25 mg total) by mouth daily. 90 tablet 6  . lisinopril (PRINIVIL,ZESTRIL) 20 MG tablet TAKE ONE TABLET BY MOUTH AT BEDTIME 30 tablet 2  . naproxen (NAPROSYN) 250 MG tablet Take 500 mg by mouth 2 (two) times daily as needed (pain/headache).     . pantoprazole (PROTONIX) 40 MG tablet Take 40 mg by mouth at bedtime.     . rosuvastatin (CRESTOR) 5 MG tablet Take 5 mg by mouth at bedtime.     . sotalol (BETAPACE) 80 MG tablet Take 1 tablet (80 mg total) by mouth 2 (two) times daily. 60 tablet 6  . gabapentin (NEURONTIN) 300 MG capsule Take 1 capsule (300 mg total) by mouth 2 (two) times daily. (Patient not taking: Reported on 04/18/2016) 60 capsule 3   No current facility-administered medications for this visit.     Allergies  Allergen Reactions  . Flecainide     SOB 2014  . Multaq [Dronedarone]     Nightmares    Social History   Social History  . Marital status: Married    Spouse name: N/A  . Number of children: 2  . Years of education: Some college   Occupational History  . N/A  Social History Main Topics  . Smoking status: Never Smoker  . Smokeless tobacco: Never Used  . Alcohol use No  . Drug use: No  . Sexual activity: Yes    Partners: Female     Comment: Married   Other Topics Concern  . Not on file   Social History Narrative   Lives at home w/ his wife   Right-handed   Caffeine: 4 drinks per day    Family History  Problem Relation Age of Onset  . CAD Mother   . Rheum arthritis Mother   . Colon cancer Father   . Atrial fibrillation Father   . Breast cancer Sister   . Lung cancer Sister   . Heart disease Maternal Grandfather   . Scleroderma Brother     Physical Exam: Vitals:     04/18/16 1327  BP: 126/82  Pulse: 83  Weight: 202 lb 12.8 oz (92 kg)  Height: 5\' 8"  (1.727 m)    GEN- The patient is well appearing, alert and oriented x 3 today.   Head- normocephalic, atraumatic Eyes-  Sclera clear, conjunctiva pink Ears- hearing intact Oropharynx- clear Neck- supple  Lungs- Clear to ausculation bilaterally, normal work of breathing Heart- Regular rate and rhythm without ectopy today, no murmurs, rubs or gallops, PMI not laterally displaced GI- soft, NT, ND, + BS Extremities- no clubbing, cyanosis, or edema  ekg today reveals sinus rhythm 83 bpm, PVCs are of a narrow QRS with LBBB and transition at V1-V2.  L superior axis.  Assessment and Plan:  1. PVCs Due to location near His, ablation has been difficult.  Today, I will stop sotalol.  After 48 hours washout, will initiate verapamil ER 120mg  daily.  He is instructed to increased to 240mg  daily if PVCs are not improved.  Could consider repeat ablation if episodes are not improved.  2. HTN Stable No change required today   Thompson Grayer MD, [facility] 04/18/2016 2:18 PM

## 2016-04-18 NOTE — Progress Notes (Signed)
Reason for visit: Fibromyalgia  Referring physician: Dr. Lemont Fillers is a 55 y.o. male  History of present illness:  Anthony Sherman is a 55 year old right-handed white male with a two-year history of diffuse neuromuscular discomfort involving the shoulders, neck, back, legs. The patient reports actual sensitivity of the skin to light touch at times. He has been on Crestor for about 7 years, but his neuromuscular symptoms have begun only 2 years ago. The patient has also had some problems with a ventricular arrhythmia, he has been placed on some medication that seems causing headaches for him about 4 hours after each dose. The patient reports generalized fatigue, no definite weakness. The patient has no true numbness but on occasion he may have some blanching of the fingers and hands and some numbness. The patient has a brother with scleroderma, and other relatives with lupus. The patient has had blood work done that included an ANA, rheumatoid factor, sedimentation rate, and a Lyme antibody panel that were unremarkable. The patient denies any significant issues with balance or difficulty controlling the bowels or the bladder. He is referred to this office for an evaluation. He has been taking over-the-counter medication such as Aleve and Tylenol with some benefit.   Past Medical History:  Diagnosis Date  . Essential hypertension, benign   . Fibromyalgia   . GERD (gastroesophageal reflux disease)   . Mixed hyperlipidemia   . POTS (postural orthostatic tachycardia syndrome)   . Premature ventricular contractions   . Syncope    2010    Past Surgical History:  Procedure Laterality Date  . APPENDECTOMY  ~ 2000  . CHOLECYSTECTOMY  ~ 2000  . LEFT HEART CATHETERIZATION WITH CORONARY ANGIOGRAM N/A 01/07/2013   Procedure: LEFT HEART CATHETERIZATION WITH CORONARY ANGIOGRAM;  Surgeon: Minus Breeding, MD;  Location: [facility] CATH LAB;  Service: Cardiovascular;  Laterality: N/A;  . PVC  ablation  04/14/13   parahisian PVC focus ablated by Dr Rayann Heman  . SUPRAVENTRICULAR TACHYCARDIA ABLATION N/A 04/14/2013   Procedure: VT Ablation;  Surgeon: Thompson Grayer, MD;  Location: [facility] CATH LAB;  Service: Cardiovascular;  Laterality: N/A;    Family History  Problem Relation Age of Onset  . CAD Mother   . Rheum arthritis Mother   . Colon cancer Father   . Atrial fibrillation Father   . Breast cancer Sister   . Lung cancer Sister   . Heart disease Maternal Grandfather     Social history:  reports that he has never smoked. He has never used smokeless tobacco. He reports that he does not drink alcohol or use drugs.  Medications:  Prior to Admission medications   Medication Sig Start Date End Date Taking? Authorizing Provider  acetaminophen (TYLENOL) 500 MG tablet Take 1,000 mg by mouth every 6 (six) hours as needed (Takes 4-6 tabs per day).    Yes Historical Provider, MD  aspirin EC 81 MG tablet Take 81 mg by mouth daily.   Yes Historical Provider, MD  cetirizine (ZYRTEC) 10 MG tablet Take 10 mg by mouth daily as needed for allergies.   Yes Historical Provider, MD  hydrochlorothiazide (HYDRODIURIL) 25 MG tablet Take 1 tablet (25 mg total) by mouth daily. 09/21/13  Yes Thompson Grayer, MD  lisinopril (PRINIVIL,ZESTRIL) 20 MG tablet TAKE ONE TABLET BY MOUTH AT BEDTIME 07/19/14  Yes Thompson Grayer, MD  naproxen (NAPROSYN) 250 MG tablet Take 250 mg by mouth daily as needed (headache).   Yes Historical Provider, MD  pantoprazole (Sylvester)  40 MG tablet Take 40 mg by mouth at bedtime.    Yes Historical Provider, MD  rosuvastatin (CRESTOR) 5 MG tablet Take 5 mg by mouth at bedtime.    Yes Historical Provider, MD  sotalol (BETAPACE) 80 MG tablet Take 1 tablet (80 mg total) by mouth 2 (two) times daily. 04/04/16  Yes Thompson Grayer, MD      Allergies  Allergen Reactions  . Flecainide     SOB 2014    ROS:  Out of a complete 14 system review of symptoms, the patient complains only of the following  symptoms, and all other reviewed systems are negative.  Fatigue Chest pain, palpitations of the heart Shortness of breath Diarrhea Feeling hot, flushing Joint pain, joint swelling, aching muscles Headache, weakness, dizziness Insomnia Decreased energy  Blood pressure 112/82, pulse 64, height 5\' 8"  (1.727 m), weight 199 lb 12 oz (90.6 kg).  Physical Exam  General: The patient is alert and cooperative at the time of the examination.  Eyes: Pupils are equal, round, and reactive to light. Discs are flat bilaterally.  Neck: The neck is supple, no carotid bruits are noted.  Respiratory: The respiratory examination is clear.  Cardiovascular: The cardiovascular examination reveals a regular rate and rhythm, no obvious murmurs or rubs are noted.  Skin: Extremities are without significant edema.  Neurologic Exam  Mental status: The patient is alert and oriented x 3 at the time of the examination. The patient has apparent normal recent and remote memory, with an apparently normal attention span and concentration ability.  Cranial nerves: Facial symmetry is present. There is good sensation of the face to pinprick and soft touch bilaterally. The strength of the facial muscles and the muscles to head turning and shoulder shrug are normal bilaterally. Speech is well enunciated, no aphasia or dysarthria is noted. Extraocular movements are full. Visual fields are full. The tongue is midline, and the patient has symmetric elevation of the soft palate. No obvious hearing deficits are noted.  Motor: The motor testing reveals 5 over 5 strength of all 4 extremities. Good symmetric motor tone is noted throughout.  Sensory: Sensory testing is intact to pinprick, soft touch, vibration sensation, and position sense on all 4 extremities. No evidence of extinction is noted.  Coordination: Cerebellar testing reveals good finger-nose-finger and heel-to-shin bilaterally.  Gait and station: Gait is normal.  Tandem gait is normal. Romberg is negative. No drift is seen.  Reflexes: Deep tendon reflexes are symmetric and normal bilaterally. Toes are downgoing bilaterally.   Assessment/Plan:  1. Probable fibromyalgia  The patient is on Crestor, but the onset of use of this medication predates the onset of clinical symptoms by at least 5 years. The patient will be sent for some further blood work, he will be started on gabapentin for the fibromyalgia pain, he will follow-up in about 4 months. If he is not able to tolerate the gabapentin, he is to contact our office.  Anthony Alexanders MD 04/18/2016 9:55 AM  [facility] [address] [place], [place] [address]  Phone [phone] Fax [phone]

## 2016-04-19 LAB — VITAMIN B12: VITAMIN B 12: 366 pg/mL (ref 211–946)

## 2016-04-19 LAB — ANGIOTENSIN CONVERTING ENZYME

## 2016-04-19 LAB — CK: Total CK: 97 U/L (ref 24–204)

## 2016-04-23 ENCOUNTER — Telehealth: Payer: Self-pay

## 2016-04-23 NOTE — Telephone Encounter (Signed)
Called pt w/ unremarkable lab results. Verbalized understanding and appreciation for call. 

## 2016-04-23 NOTE — Telephone Encounter (Signed)
-----   Message from Kathrynn Ducking, MD sent at 04/19/2016 11:42 AM EDT -----  The blood work results are unremarkable. Please call the patient.  ----- Message ----- From: Lavone Neri Lab Results In Sent: 04/19/2016   7:42 AM To: Kathrynn Ducking, MD

## 2016-05-11 ENCOUNTER — Ambulatory Visit (INDEPENDENT_AMBULATORY_CARE_PROVIDER_SITE_OTHER): Payer: BLUE CROSS/BLUE SHIELD | Admitting: Internal Medicine

## 2016-05-11 ENCOUNTER — Encounter: Payer: Self-pay | Admitting: Internal Medicine

## 2016-05-11 VITALS — BP 135/76 | HR 50 | Ht 68.0 in | Wt 202.6 lb

## 2016-05-11 DIAGNOSIS — I10 Essential (primary) hypertension: Secondary | ICD-10-CM | POA: Diagnosis not present

## 2016-05-11 DIAGNOSIS — I493 Ventricular premature depolarization: Secondary | ICD-10-CM

## 2016-05-11 MED ORDER — VERAPAMIL HCL ER 120 MG PO TBCR: EXTENDED_RELEASE_TABLET | ORAL | Status: DC

## 2016-05-11 NOTE — Addendum Note (Signed)
Addended by: Laurine Blazer on: 05/11/2016 09:53 AM   Modules accepted: Orders

## 2016-05-11 NOTE — Progress Notes (Signed)
PCP:  Rory Percy, MD Primary Cardiologist:  Dr Domenic Polite  The patient presents today for electrophysiology followup.  He continues to have PVCs intermittently.  He feels that they are somewhat improved with verapamil.  His headaches have resolved off of sotalol.  He is also pleased that his pain has improved with neurontin recently started by Dr Jannifer Franklin.  Today, he denies symptoms of chest pain, orthopnea, PND, lower extremity edema, dizziness, presyncope, syncope, or neurologic sequela.  The patient feels that he is tolerating medications without difficulties and is otherwise without complaint today.   Past Medical History:  Diagnosis Date  . Essential hypertension, benign   . Fibromyalgia   . GERD (gastroesophageal reflux disease)   . Mixed hyperlipidemia   . POTS (postural orthostatic tachycardia syndrome)   . Premature ventricular contractions   . Syncope    2010   Past Surgical History:  Procedure Laterality Date  . APPENDECTOMY  ~ 2000  . CHOLECYSTECTOMY  ~ 2000  . LEFT HEART CATHETERIZATION WITH CORONARY ANGIOGRAM N/A 01/07/2013   Procedure: LEFT HEART CATHETERIZATION WITH CORONARY ANGIOGRAM;  Surgeon: Minus Breeding, MD;  Location: [facility] CATH LAB;  Service: Cardiovascular;  Laterality: N/A;  . PVC ablation  04/14/13   parahisian PVC focus ablated by Dr Rayann Heman  . SUPRAVENTRICULAR TACHYCARDIA ABLATION N/A 04/14/2013   Procedure: VT Ablation;  Surgeon: Thompson Grayer, MD;  Location: [facility] CATH LAB;  Service: Cardiovascular;  Laterality: N/A;    Current Outpatient Prescriptions  Medication Sig Dispense Refill  . acetaminophen (TYLENOL) 500 MG tablet Take 2 tablets by mouth every 8-12 hours as needed for pain/headache    . aspirin EC 81 MG tablet Take 81 mg by mouth daily.    . cetirizine (ZYRTEC) 10 MG tablet Take 10 mg by mouth daily as needed for allergies.    Marland Kitchen gabapentin (NEURONTIN) 300 MG capsule Take 300 mg by mouth 2 (two) times daily.    . hydrochlorothiazide (HYDRODIURIL) 25  MG tablet Take 1 tablet (25 mg total) by mouth daily. 90 tablet 6  . lisinopril (PRINIVIL,ZESTRIL) 20 MG tablet TAKE ONE TABLET BY MOUTH AT BEDTIME 30 tablet 2  . naproxen (NAPROSYN) 250 MG tablet Take 500 mg by mouth 2 (two) times daily as needed (pain/headache).     . pantoprazole (PROTONIX) 40 MG tablet Take 40 mg by mouth at bedtime.     . rosuvastatin (CRESTOR) 5 MG tablet Take 5 mg by mouth at bedtime.     . verapamil (VERELAN PM) 120 MG 24 hr capsule Take 1 capsule (120 mg total) by mouth daily. 30 capsule 3   No current facility-administered medications for this visit.     Allergies  Allergen Reactions  . Flecainide     SOB 2014  . Multaq [Dronedarone]     Nightmares    Social History   Social History  . Marital status: Married    Spouse name: N/A  . Number of children: 2  . Years of education: Some college   Occupational History  . N/A    Social History Main Topics  . Smoking status: Never Smoker  . Smokeless tobacco: Never Used  . Alcohol use No  . Drug use: No  . Sexual activity: Yes    Partners: Female     Comment: Married   Other Topics Concern  . Not on file   Social History Narrative   Lives at home w/ his wife   Right-handed   Caffeine: 4 drinks per day  Family History  Problem Relation Age of Onset  . CAD Mother   . Rheum arthritis Mother   . Colon cancer Father   . Atrial fibrillation Father   . Breast cancer Sister   . Lung cancer Sister   . Heart disease Maternal Grandfather   . Scleroderma Brother     Physical Exam: Vitals:   05/11/16 0853  BP: 135/76  Pulse: (!) 50  SpO2: 97%  Weight: 202 lb 9.6 oz (91.9 kg)  Height: 5\' 8"  (1.727 m)    GEN- The patient is well appearing, alert and oriented x 3 today.   Head- normocephalic, atraumatic Eyes-  Sclera clear, conjunctiva pink Ears- hearing intact Oropharynx- clear Neck- supple  Lungs- Clear to ausculation bilaterally, normal work of breathing Heart- Regular rate and rhythm  with rare ectopy today, no murmurs, rubs or gallops, PMI not laterally displaced GI- soft, NT, ND, + BS Extremities- no clubbing, cyanosis, or edema  ekg today reveals sinus rhythm   Assessment and Plan:  1. PVCs Improved. Increase verapamil to 180mg  or even 240mg  daily if PVCs are not improved.  Could consider repeat ablation if episodes are not improved.  2. HTN Stable No change required today   Thompson Grayer MD, [facility] 05/11/2016 9:40 AM

## 2016-05-11 NOTE — Patient Instructions (Signed)
Medication Instructions:   Increase Verapamil to 1 1/2 tabs daily, if no improvement - can increase further to 2 tabs daily.   Continue all other medications.    Labwork: none  Testing/Procedures: none  Follow-Up: 4 weeks   Any Other Special Instructions Will Be Listed Below (If Applicable).  If you need a refill on your cardiac medications before your next appointment, please call your pharmacy.

## 2016-05-30 ENCOUNTER — Telehealth: Payer: Self-pay | Admitting: Internal Medicine

## 2016-05-30 MED ORDER — VERAPAMIL HCL ER 240 MG PO TBCR: 240.0000 mg | EXTENDED_RELEASE_TABLET | Freq: Every day | ORAL | 6 refills | Status: DC

## 2016-05-30 NOTE — Telephone Encounter (Signed)
Mr. Callow called stating that the Verapamil is working. He is wanting to go to the 240 mg of Verapamil.  [facility]

## 2016-05-30 NOTE — Telephone Encounter (Signed)
Patient stated that he feels better on the 240mg  of the Verapamil.  States that it takes care of about 50% of his symptoms.  Will send new rx to [place] now.  Already has follow up scheduled for 06/15/2016 with Dr. Rayann Heman.

## 2016-06-13 ENCOUNTER — Ambulatory Visit (INDEPENDENT_AMBULATORY_CARE_PROVIDER_SITE_OTHER): Payer: BLUE CROSS/BLUE SHIELD | Admitting: Internal Medicine

## 2016-06-13 ENCOUNTER — Encounter: Payer: Self-pay | Admitting: Internal Medicine

## 2016-06-13 VITALS — BP 115/77 | HR 75 | Ht 68.0 in | Wt 197.0 lb

## 2016-06-13 DIAGNOSIS — I493 Ventricular premature depolarization: Secondary | ICD-10-CM

## 2016-06-13 DIAGNOSIS — I10 Essential (primary) hypertension: Secondary | ICD-10-CM | POA: Diagnosis not present

## 2016-06-13 MED ORDER — VERAPAMIL HCL ER 240 MG PO TBCR: 240.0000 mg | EXTENDED_RELEASE_TABLET | Freq: Every day | ORAL | 3 refills | Status: DC

## 2016-06-13 NOTE — Patient Instructions (Addendum)
Medication Instructions:   Continue the Verapamil at 240mg  daily.  Continue all other medications.    Labwork: none  Testing/Procedures: none  Follow-Up: 3 months   Any Other Special Instructions Will Be Listed Below (If Applicable).  If you need a refill on your cardiac medications before your next appointment, please call your pharmacy.

## 2016-06-13 NOTE — Progress Notes (Signed)
PCP:  Rory Percy, MD Primary Cardiologist:  Dr Domenic Polite  The patient presents today for electrophysiology followup.  His PVCs are "50% better" with verapamil.  He seems more bothered at this time by his fibromyalgia.   His wife had CABG 05/18/16 after presenting with flash pulmonary edema to [facility].  She unfortunately had CVA 2 days later requiring embolectomy.  She is in rehab but making slow recovery.  He finds this to be his most important focus att his time.  Today, he denies symptoms of chest pain, orthopnea, PND, lower extremity edema, dizziness, presyncope, syncope, or neurologic sequela.  The patient feels that he is tolerating medications without difficulties and is otherwise without complaint today.   Past Medical History:  Diagnosis Date  . Essential hypertension, benign   . Fibromyalgia   . GERD (gastroesophageal reflux disease)   . Mixed hyperlipidemia   . POTS (postural orthostatic tachycardia syndrome)   . Premature ventricular contractions   . Syncope    2010   Past Surgical History:  Procedure Laterality Date  . APPENDECTOMY  ~ 2000  . CHOLECYSTECTOMY  ~ 2000  . LEFT HEART CATHETERIZATION WITH CORONARY ANGIOGRAM N/A 01/07/2013   Procedure: LEFT HEART CATHETERIZATION WITH CORONARY ANGIOGRAM;  Surgeon: Minus Breeding, MD;  Location: [facility] CATH LAB;  Service: Cardiovascular;  Laterality: N/A;  . PVC ablation  04/14/13   parahisian PVC focus ablated by Dr Rayann Heman  . SUPRAVENTRICULAR TACHYCARDIA ABLATION N/A 04/14/2013   Procedure: VT Ablation;  Surgeon: Thompson Grayer, MD;  Location: [facility] CATH LAB;  Service: Cardiovascular;  Laterality: N/A;    Current Outpatient Prescriptions  Medication Sig Dispense Refill  . acetaminophen (TYLENOL) 500 MG tablet Take 2 tablets by mouth every 8-12 hours as needed for pain/headache    . aspirin EC 81 MG tablet Take 81 mg by mouth daily.    . cetirizine (ZYRTEC) 10 MG tablet Take 10 mg by mouth daily as needed for allergies.    Marland Kitchen gabapentin  (NEURONTIN) 300 MG capsule Take 300 mg by mouth 2 (two) times daily.    . hydrochlorothiazide (HYDRODIURIL) 25 MG tablet Take 1 tablet (25 mg total) by mouth daily. 90 tablet 6  . lisinopril (PRINIVIL,ZESTRIL) 20 MG tablet TAKE ONE TABLET BY MOUTH AT BEDTIME 30 tablet 2  . naproxen (NAPROSYN) 250 MG tablet Take 500 mg by mouth 2 (two) times daily as needed (pain/headache).     . pantoprazole (PROTONIX) 40 MG tablet Take 40 mg by mouth at bedtime.     . rosuvastatin (CRESTOR) 5 MG tablet Take 5 mg by mouth at bedtime.     . verapamil (CALAN-SR) 240 MG CR tablet Take 1 tablet (240 mg total) by mouth daily. 90 tablet 3   No current facility-administered medications for this visit.     Allergies  Allergen Reactions  . Flecainide     SOB 2014  . Multaq [Dronedarone]     Nightmares    Social History   Social History  . Marital status: Married    Spouse name: N/A  . Number of children: 2  . Years of education: Some college   Occupational History  . N/A    Social History Main Topics  . Smoking status: Never Smoker  . Smokeless tobacco: Never Used  . Alcohol use No  . Drug use: No  . Sexual activity: Yes    Partners: Female     Comment: Married   Other Topics Concern  . Not on file  Social History Narrative   Lives at home w/ his wife   Right-handed   Caffeine: 4 drinks per day    Family History  Problem Relation Age of Onset  . CAD Mother   . Rheum arthritis Mother   . Colon cancer Father   . Atrial fibrillation Father   . Breast cancer Sister   . Lung cancer Sister   . Heart disease Maternal Grandfather   . Scleroderma Brother     Physical Exam: Vitals:   06/13/16 0817  BP: 115/77  Pulse: 75  Weight: 197 lb (89.4 kg)  Height: 5\' 8"  (1.727 m)    GEN- The patient is well appearing, alert and oriented x 3 today.   Head- normocephalic, atraumatic Eyes-  Sclera clear, conjunctiva pink Ears- hearing intact Oropharynx- clear Neck- supple  Lungs- Clear to  ausculation bilaterally, normal work of breathing Heart- Regular rate and rhythm with no ectopy today, no murmurs, rubs or gallops, PMI not laterally displaced GI- soft, NT, ND, + BS Extremities- no clubbing, cyanosis, or edema  ekg today reveals sinus rhythm   Assessment and Plan:  1. PVCs Improved with verapamil 240mg  daily.  Could consider repeat ablation if episodes are not improved.  2. HTN Stable No change required today  Return to see me in 3 months   Thompson Grayer MD, [facility] 06/13/2016 8:48 AM

## 2016-06-15 ENCOUNTER — Ambulatory Visit: Payer: BLUE CROSS/BLUE SHIELD | Admitting: Internal Medicine

## 2016-07-18 ENCOUNTER — Other Ambulatory Visit: Payer: Self-pay | Admitting: Neurology

## 2016-08-20 ENCOUNTER — Other Ambulatory Visit: Payer: Self-pay | Admitting: Neurology

## 2016-08-21 ENCOUNTER — Telehealth: Payer: Self-pay

## 2016-08-21 NOTE — Telephone Encounter (Signed)
Appt for tomorrow am r/s for next week due to winter weather. Pt asked about gabapentin rx notified him that refills were sent in earlier today. Will discuss dose changes w/ MD at appt next wk.

## 2016-08-22 ENCOUNTER — Ambulatory Visit: Payer: Self-pay | Admitting: Neurology

## 2016-08-29 ENCOUNTER — Ambulatory Visit (INDEPENDENT_AMBULATORY_CARE_PROVIDER_SITE_OTHER): Payer: 59 | Admitting: Neurology

## 2016-08-29 ENCOUNTER — Encounter: Payer: Self-pay | Admitting: Neurology

## 2016-08-29 VITALS — BP 137/84 | HR 74 | Ht 68.0 in | Wt 197.0 lb

## 2016-08-29 DIAGNOSIS — M797 Fibromyalgia: Secondary | ICD-10-CM | POA: Diagnosis not present

## 2016-08-29 MED ORDER — GABAPENTIN 300 MG PO CAPS: 600.0000 mg | ORAL_CAPSULE | Freq: Two times a day (BID) | ORAL | 1 refills | Status: DC

## 2016-08-29 NOTE — Progress Notes (Signed)
Reason for visit: Fibromyalgia  Anthony Sherman is an 56 y.o. male  History of present illness:  Anthony Sherman is a 56 year old right-handed white male with a history of fibromyalgia. The patient has diffuse neuromuscular discomfort. He was placed on gabapentin which seemed to improve his symptoms significantly initially on a 300 mg twice daily dosing. The pain has returned, but he has not called for a dose adjustment of the medication. The patient is trying to stay active, he will walk on a regular basis. If he becomes too active, the pain will worsen. He denies any weakness or balance changes. He is otherwise tolerating the gabapentin well.  Past Medical History:  Diagnosis Date  . Essential hypertension, benign   . Fibromyalgia   . GERD (gastroesophageal reflux disease)   . Mixed hyperlipidemia   . POTS (postural orthostatic tachycardia syndrome)   . Premature ventricular contractions   . Syncope    2010    Past Surgical History:  Procedure Laterality Date  . APPENDECTOMY  ~ 2000  . CHOLECYSTECTOMY  ~ 2000  . LEFT HEART CATHETERIZATION WITH CORONARY ANGIOGRAM N/A 01/07/2013   Procedure: LEFT HEART CATHETERIZATION WITH CORONARY ANGIOGRAM;  Surgeon: Minus Breeding, MD;  Location: [facility] CATH LAB;  Service: Cardiovascular;  Laterality: N/A;  . PVC ablation  04/14/13   parahisian PVC focus ablated by Dr Rayann Heman  . SUPRAVENTRICULAR TACHYCARDIA ABLATION N/A 04/14/2013   Procedure: VT Ablation;  Surgeon: Thompson Grayer, MD;  Location: [facility] CATH LAB;  Service: Cardiovascular;  Laterality: N/A;    Family History  Problem Relation Age of Onset  . CAD Mother   . Rheum arthritis Mother   . Colon cancer Father   . Atrial fibrillation Father   . Breast cancer Sister   . Lung cancer Sister   . Heart disease Maternal Grandfather   . Scleroderma Brother     Social history:  reports that he has never smoked. He has never used smokeless tobacco. He reports that he does not drink alcohol or use  drugs.    Allergies  Allergen Reactions  . Flecainide     SOB 2014  . Multaq [Dronedarone]     Nightmares    Medications:  Prior to Admission medications   Medication Sig Start Date End Date Taking? Authorizing Provider  acetaminophen (TYLENOL) 500 MG tablet Take 2 tablets by mouth every 8-12 hours as needed for pain/headache   Yes Historical Provider, MD  aspirin EC 81 MG tablet Take 81 mg by mouth daily.   Yes Historical Provider, MD  cetirizine (ZYRTEC) 10 MG tablet Take 10 mg by mouth daily as needed for allergies.   Yes Historical Provider, MD  gabapentin (NEURONTIN) 300 MG capsule Take 2 capsules (600 mg total) by mouth 2 (two) times daily. 08/29/16  Yes Kathrynn Ducking, MD  hydrochlorothiazide (HYDRODIURIL) 25 MG tablet Take 1 tablet (25 mg total) by mouth daily. 09/21/13  Yes Thompson Grayer, MD  lisinopril (PRINIVIL,ZESTRIL) 20 MG tablet TAKE ONE TABLET BY MOUTH AT BEDTIME 07/19/14  Yes Thompson Grayer, MD  naproxen (NAPROSYN) 250 MG tablet Take 500 mg by mouth 2 (two) times daily as needed (pain/headache).    Yes Historical Provider, MD  pantoprazole (PROTONIX) 40 MG tablet Take 40 mg by mouth at bedtime.    Yes Historical Provider, MD  rosuvastatin (CRESTOR) 5 MG tablet Take 5 mg by mouth at bedtime.    Yes Historical Provider, MD  verapamil (CALAN-SR) 240 MG CR tablet Take 1 tablet (  240 mg total) by mouth daily. 06/13/16  Yes Thompson Grayer, MD    ROS:  Out of a complete 14 system review of symptoms, the patient complains only of the following symptoms, and all other reviewed systems are negative.  Fatigue Light sensitivity Shortness of breath Chest pain, palpitations of the heart Cold intolerance, heat intolerance Insomnia, snoring Joint pain, joint swelling, back pain, achy muscles, muscle cramps Skin rash Numbness, weakness  Blood pressure 137/84, pulse 74, height 5\' 8"  (1.727 m), weight 197 lb (89.4 kg).  Physical Exam  General: The patient is alert and cooperative  at the time of the examination.  Neuromuscular: The patient is able to flex the low back to about 110. The patient has good rotational movement of the low back.  Skin: No significant peripheral edema is noted.   Neurologic Exam  Mental status: The patient is alert and oriented x 3 at the time of the examination. The patient has apparent normal recent and remote memory, with an apparently normal attention span and concentration ability.   Cranial nerves: Facial symmetry is present. Speech is normal, no aphasia or dysarthria is noted. Extraocular movements are full. Visual fields are full.  Motor: The patient has good strength in all 4 extremities.  Sensory examination: Soft touch sensation is symmetric on the face, arms, and legs.  Coordination: The patient has good finger-nose-finger and heel-to-shin bilaterally.  Gait and station: The patient has a normal gait. Tandem gait is normal. Romberg is negative. No drift is seen.  Reflexes: Deep tendon reflexes are symmetric.   Assessment/Plan:  1. Fibromyalgia  The patient has gained some benefit from gabapentin, we will go up on the dose taking 600 mg twice daily, the patient will call for any dose adjustments. Will follow-up otherwise in 6 months. A prescription was written for the gabapentin.  Jill Alexanders MD 08/29/2016 8:10 AM  [facility] [address] [place], [place] [address]  Phone [phone] Fax [phone]

## 2016-09-07 ENCOUNTER — Ambulatory Visit (INDEPENDENT_AMBULATORY_CARE_PROVIDER_SITE_OTHER): Payer: 59 | Admitting: Internal Medicine

## 2016-09-07 ENCOUNTER — Encounter: Payer: Self-pay | Admitting: Internal Medicine

## 2016-09-07 VITALS — BP 133/89 | HR 71 | Ht 68.0 in | Wt 198.8 lb

## 2016-09-07 DIAGNOSIS — I10 Essential (primary) hypertension: Secondary | ICD-10-CM | POA: Diagnosis not present

## 2016-09-07 DIAGNOSIS — I493 Ventricular premature depolarization: Secondary | ICD-10-CM

## 2016-09-07 MED ORDER — HYDROCHLOROTHIAZIDE 25 MG PO TABS: 25.0000 mg | ORAL_TABLET | ORAL | Status: DC | PRN

## 2016-09-07 NOTE — Patient Instructions (Signed)

## 2016-09-07 NOTE — Progress Notes (Signed)
PCP:  Rory Percy, MD Primary Cardiologist:  Dr Domenic Polite  The patient presents today for electrophysiology followup.  His PVCs remain controlled with verapamil.  He seems more bothered at this time by his fibromyalgia.   His wife had CABG 05/18/16 after presenting with flash pulmonary edema to [facility].  She unfortunately had CVA 2 days later requiring embolectomy.  She is now at home.  Her recovery remains slow.  Today, he denies symptoms of chest pain, orthopnea, PND, lower extremity edema, dizziness, presyncope, syncope, or neurologic sequela.  The patient feels that he is tolerating medications without difficulties and is otherwise without complaint today.   Past Medical History:  Diagnosis Date  . Essential hypertension, benign   . Fibromyalgia   . GERD (gastroesophageal reflux disease)   . Mixed hyperlipidemia   . POTS (postural orthostatic tachycardia syndrome)   . Premature ventricular contractions   . Syncope    2010   Past Surgical History:  Procedure Laterality Date  . APPENDECTOMY  ~ 2000  . CHOLECYSTECTOMY  ~ 2000  . LEFT HEART CATHETERIZATION WITH CORONARY ANGIOGRAM N/A 01/07/2013   Procedure: LEFT HEART CATHETERIZATION WITH CORONARY ANGIOGRAM;  Surgeon: Minus Breeding, MD;  Location: [facility] CATH LAB;  Service: Cardiovascular;  Laterality: N/A;  . PVC ablation  04/14/13   parahisian PVC focus ablated by Dr Rayann Heman  . SUPRAVENTRICULAR TACHYCARDIA ABLATION N/A 04/14/2013   Procedure: VT Ablation;  Surgeon: Thompson Grayer, MD;  Location: [facility] CATH LAB;  Service: Cardiovascular;  Laterality: N/A;    Current Outpatient Prescriptions  Medication Sig Dispense Refill  . acetaminophen (TYLENOL) 500 MG tablet Take 2 tablets by mouth every 8-12 hours as needed for pain/headache    . aspirin EC 81 MG tablet Take 81 mg by mouth daily.    . cetirizine (ZYRTEC) 10 MG tablet Take 10 mg by mouth daily as needed for allergies.    Marland Kitchen gabapentin (NEURONTIN) 300 MG capsule Take 2 capsules (600 mg  total) by mouth 2 (two) times daily. 360 capsule 1  . hydrochlorothiazide (HYDRODIURIL) 25 MG tablet Take 1 tablet (25 mg total) by mouth daily. 90 tablet 6  . lisinopril (PRINIVIL,ZESTRIL) 20 MG tablet TAKE ONE TABLET BY MOUTH AT BEDTIME 30 tablet 2  . naproxen (NAPROSYN) 250 MG tablet Take 500 mg by mouth 2 (two) times daily as needed (pain/headache).     . pantoprazole (PROTONIX) 40 MG tablet Take 40 mg by mouth at bedtime.     . rosuvastatin (CRESTOR) 5 MG tablet Take 5 mg by mouth at bedtime.     . verapamil (CALAN-SR) 240 MG CR tablet Take 1 tablet (240 mg total) by mouth daily. 90 tablet 3   No current facility-administered medications for this visit.     Allergies  Allergen Reactions  . Flecainide     SOB 2014  . Multaq [Dronedarone]     Nightmares    Social History   Social History  . Marital status: Married    Spouse name: N/A  . Number of children: 2  . Years of education: Some college   Occupational History  . N/A    Social History Main Topics  . Smoking status: Never Smoker  . Smokeless tobacco: Never Used  . Alcohol use No  . Drug use: No  . Sexual activity: Yes    Partners: Female     Comment: Married   Other Topics Concern  . Not on file   Social History Narrative   Lives at home  w/ his wife   Right-handed   Caffeine: 4 drinks per day    Family History  Problem Relation Age of Onset  . CAD Mother   . Rheum arthritis Mother   . Colon cancer Father   . Atrial fibrillation Father   . Breast cancer Sister   . Lung cancer Sister   . Heart disease Maternal Grandfather   . Scleroderma Brother     Physical Exam: Vitals:   09/07/16 0830  BP: 133/89  Pulse: 71  SpO2: 98%  Weight: 198 lb 12.8 oz (90.2 kg)  Height: 5\' 8"  (1.727 m)    GEN- The patient is well appearing, alert and oriented x 3 today.   Head- normocephalic, atraumatic Eyes-  Sclera clear, conjunctiva pink Ears- hearing intact Oropharynx- clear Neck- supple  Lungs- Clear to  ausculation bilaterally, normal work of breathing Heart- Regular rate and rhythm with no ectopy today, no murmurs, rubs or gallops, PMI not laterally displaced GI- soft, NT, ND, + BS Extremities- no clubbing, cyanosis, or edema   Assessment and Plan:  1. PVCs Improved with verapamil 240mg  daily.  No changes today.  Could consider repeat ablation if episodes are not improved.  2. HTN Stable No change required today  Return to see me in 6 months   Thompson Grayer MD, [facility] 09/07/2016 9:05 AM

## 2016-09-07 NOTE — Addendum Note (Signed)
Addended by: Laurine Blazer on: 09/07/2016 09:15 AM   Modules accepted: Orders

## 2017-01-30 ENCOUNTER — Other Ambulatory Visit: Payer: Self-pay | Admitting: Neurology

## 2017-02-26 ENCOUNTER — Ambulatory Visit (INDEPENDENT_AMBULATORY_CARE_PROVIDER_SITE_OTHER): Payer: 59 | Admitting: Neurology

## 2017-02-26 ENCOUNTER — Encounter (INDEPENDENT_AMBULATORY_CARE_PROVIDER_SITE_OTHER): Payer: Self-pay

## 2017-02-26 ENCOUNTER — Encounter: Payer: Self-pay | Admitting: Neurology

## 2017-02-26 VITALS — BP 141/96 | HR 71 | Ht 68.0 in | Wt 193.0 lb

## 2017-02-26 DIAGNOSIS — R5382 Chronic fatigue, unspecified: Secondary | ICD-10-CM | POA: Diagnosis not present

## 2017-02-26 DIAGNOSIS — M797 Fibromyalgia: Secondary | ICD-10-CM | POA: Diagnosis not present

## 2017-02-26 MED ORDER — GABAPENTIN 800 MG PO TABS: 800.0000 mg | ORAL_TABLET | Freq: Two times a day (BID) | ORAL | 2 refills | Status: DC

## 2017-02-26 NOTE — Progress Notes (Signed)
Reason for visit: Fibromyalgia  Anthony Sherman is an 56 y.o. male  History of present illness:  Anthony Sherman is a 56 year old right-handed white male with a history of fibromyalgia associated with chronic pain and fatigue. The patient has gained benefit with gabapentin taking 600 mg twice daily, but after several months the pain seems to come back on again. The patient is having difficulty with sleeping at night, he will wake up every 2 hours so in part secondary to pain. The patient has a problem with concentration, he has difficulty thinking clearly. He tries to do low grade exercise, he will walk the dog and try to keep moving during the day as this seems to help the discomfort. The patient is on Crestor but he claims that he started the medication years before the fibromyalgia symptoms began and he has come off the medication without benefit. Occasionally he may have some cramps in the legs at night. He returns to this office for an evaluation.  Past Medical History:  Diagnosis Date  . Essential hypertension, benign   . Fibromyalgia   . GERD (gastroesophageal reflux disease)   . Mixed hyperlipidemia   . POTS (postural orthostatic tachycardia syndrome)   . Premature ventricular contractions   . Syncope    2010    Past Surgical History:  Procedure Laterality Date  . APPENDECTOMY  ~ 2000  . CHOLECYSTECTOMY  ~ 2000  . LEFT HEART CATHETERIZATION WITH CORONARY ANGIOGRAM N/A 01/07/2013   Procedure: LEFT HEART CATHETERIZATION WITH CORONARY ANGIOGRAM;  Surgeon: Minus Breeding, MD;  Location: [facility] CATH LAB;  Service: Cardiovascular;  Laterality: N/A;  . PVC ablation  04/14/13   parahisian PVC focus ablated by Dr Rayann Heman  . SUPRAVENTRICULAR TACHYCARDIA ABLATION N/A 04/14/2013   Procedure: VT Ablation;  Surgeon: Thompson Grayer, MD;  Location: [facility] CATH LAB;  Service: Cardiovascular;  Laterality: N/A;    Family History  Problem Relation Age of Onset  . CAD Mother   . Rheum arthritis Mother   .  Colon cancer Father   . Atrial fibrillation Father   . Breast cancer Sister   . Lung cancer Sister   . Heart disease Maternal Grandfather   . Scleroderma Brother     Social history:  reports that he has never smoked. He has never used smokeless tobacco. He reports that he does not drink alcohol or use drugs.    Allergies  Allergen Reactions  . Flecainide     SOB 2014  . Multaq [Dronedarone]     Nightmares    Medications:  Prior to Admission medications   Medication Sig Start Date End Date Taking? Authorizing Provider  acetaminophen (TYLENOL) 500 MG tablet Take 2 tablets by mouth every 8-12 hours as needed for pain/headache   Yes [provider]  aspirin EC 81 MG tablet Take 81 mg by mouth daily.   Yes [provider]  cetirizine (ZYRTEC) 10 MG tablet Take 10 mg by mouth daily as needed for allergies.   Yes [provider]  gabapentin (NEURONTIN) 300 MG capsule TAKE TWO (2) CAPSULES BY MOUTH TWICE DAILY. 01/30/17  Yes Kathrynn Ducking, MD  lisinopril (PRINIVIL,ZESTRIL) 20 MG tablet TAKE ONE TABLET BY MOUTH AT BEDTIME 07/19/14  Yes Allred, Jeneen Rinks, MD  naproxen (NAPROSYN) 250 MG tablet Take 500 mg by mouth 2 (two) times daily as needed (pain/headache).    Yes [provider]  pantoprazole (PROTONIX) 40 MG tablet Take 40 mg by mouth at bedtime.  Yes [provider]  rosuvastatin (CRESTOR) 5 MG tablet Take 5 mg by mouth at bedtime.    Yes [provider]  verapamil (CALAN-SR) 240 MG CR tablet Take 1 tablet (240 mg total) by mouth daily. 06/13/16  Yes Allred, Jeneen Rinks, MD    ROS:  Out of a complete 14 system review of symptoms, the patient complains only of the following symptoms, and all other reviewed systems are negative.  Fatigue  Light sensitivity Chest pain, palpitations Insomnia Dizziness Decreased concentration  Blood pressure (!) 141/96, pulse 71, height 5\' 8"  (1.727 m), weight 193 lb (87.5 kg).  Physical  Exam  General: The patient is alert and cooperative at the time of the examination.  Skin: No significant peripheral edema is noted.   Neurologic Exam  Mental status: The patient is alert and oriented x 3 at the time of the examination. The patient has apparent normal recent and remote memory, with an apparently normal attention span and concentration ability.   Cranial nerves: Facial symmetry is present. Speech is normal, no aphasia or dysarthria is noted. Extraocular movements are full. Visual fields are full.  Motor: The patient has good strength in all 4 extremities.  Sensory examination: Soft touch sensation is symmetric on the face, arms, and legs.  Coordination: The patient has good finger-nose-finger and heel-to-shin bilaterally.  Gait and station: The patient has a normal gait. Tandem gait is normal. Romberg is negative. No drift is seen.  Reflexes: Deep tendon reflexes are symmetric.   Assessment/Plan:  1. Fibromyalgia  2. Chronic fatigue  The patient continues to have ongoing symptoms, we will go back up on the gabapentin taking 800 mg twice daily, we can continue to increase the dose if needed. Cymbalta can be added in the future. We will check a vitamin D and a testosterone level as these could potentially be contributing factors if they are low. He will follow-up in 6 months, sooner if needed.   Anthony Alexanders MD 02/26/2017 7:35 AM  [facility] [address] [place], [place] [address]  Phone [phone] Fax [phone]

## 2017-02-26 NOTE — Patient Instructions (Signed)
   We will go up on the gabapentin dose to 800 mg twice a day.  Neurontin (gabapentin) may result in drowsiness, ankle swelling, gait instability, or possibly dizziness. Please contact our office if significant side effects occur with this medication.

## 2017-03-03 LAB — VITAMIN D 1,25 DIHYDROXY
Vitamin D 1, 25 (OH)2 Total: 31 pg/mL
Vitamin D3 1, 25 (OH)2: 29 pg/mL

## 2017-03-03 LAB — TESTOSTERONE: Testosterone: 197 ng/dL — ABNORMAL LOW (ref 264–916)

## 2017-03-04 ENCOUNTER — Telehealth: Payer: Self-pay | Admitting: *Deleted

## 2017-03-04 ENCOUNTER — Telehealth: Payer: Self-pay | Admitting: Neurology

## 2017-03-04 MED ORDER — TESTOSTERONE 50 MG/5GM (1%) TD GEL: 5.0000 g | Freq: Every day | TRANSDERMAL | 3 refills | Status: DC

## 2017-03-04 MED ORDER — TESTOSTERONE 25 MG/2.5GM (1%) TD GEL: 50.0000 mg | Freq: Every day | TRANSDERMAL | 4 refills | Status: DC

## 2017-03-04 NOTE — Telephone Encounter (Signed)
We will try the brand name Testim

## 2017-03-04 NOTE — Addendum Note (Signed)
Addended by: Kathrynn Ducking on: 03/04/2017 04:35 PM   Modules accepted: Orders

## 2017-03-04 NOTE — Telephone Encounter (Signed)
PA testosterone denied because it did not meet the following: "Per health plan criterion for testosterone 1% gel, it can be approved if: your patient has a history of failure, contraindication or intolerance to all of the following: Brand Testim and androderm. (Both of these still require PA's)."

## 2017-03-04 NOTE — Telephone Encounter (Signed)
Initiated PA testosterone on covermymeds. Key: [id number]. Awaiting response.

## 2017-03-04 NOTE — Telephone Encounter (Signed)
Faxed printed/signed rx testosterone to pt pharmacy. Fax: [phone]. Received confirmation.

## 2017-03-04 NOTE — Telephone Encounter (Signed)
I called patient. Blood work shows a normal vitamin D level, low normal. Testosterone level was low. We will try replacement of testosterone for several months to see if this improves his symptoms of fatigue.

## 2017-03-05 NOTE — Telephone Encounter (Signed)
Called pharmacy. Got answering service. They took message for pharmacy. Advised to cx rx testosterone gel 1% gel. We sent rx testim instead. She verbalized understanding.

## 2017-03-05 NOTE — Telephone Encounter (Signed)
Faxed printed/signed rx Testim to pt pharmacy. Fax: [phone]. Received confirmation.

## 2017-03-15 ENCOUNTER — Ambulatory Visit (INDEPENDENT_AMBULATORY_CARE_PROVIDER_SITE_OTHER): Payer: 59 | Admitting: Internal Medicine

## 2017-03-15 ENCOUNTER — Encounter: Payer: Self-pay | Admitting: Internal Medicine

## 2017-03-15 VITALS — BP 138/99 | HR 75 | Ht 68.0 in | Wt 196.0 lb

## 2017-03-15 DIAGNOSIS — I10 Essential (primary) hypertension: Secondary | ICD-10-CM

## 2017-03-15 DIAGNOSIS — I493 Ventricular premature depolarization: Secondary | ICD-10-CM | POA: Diagnosis not present

## 2017-03-15 NOTE — Progress Notes (Signed)
PCP: Rory Percy, MD Primary Cardiologist: Dr Domenic Polite Primary EP: Dr Rayann Heman  Anthony Sherman is a 56 y.o. male who presents today for routine electrophysiology followup.  Since last being seen in our clinic, the patient reports doing reasonably well.  He has chronic fatigue and fibromyalgia (Dr Jannifer Franklin' recent note is reviewed).  Today, he denies symptoms of palpitations, chest pain, shortness of breath,  lower extremity edema, dizziness, presyncope, or syncope.  The patient is otherwise without complaint today.   Past Medical History:  Diagnosis Date  . Essential hypertension, benign   . Fibromyalgia   . GERD (gastroesophageal reflux disease)   . Mixed hyperlipidemia   . POTS (postural orthostatic tachycardia syndrome)   . Premature ventricular contractions   . Syncope    2010   Past Surgical History:  Procedure Laterality Date  . APPENDECTOMY  ~ 2000  . CHOLECYSTECTOMY  ~ 2000  . LEFT HEART CATHETERIZATION WITH CORONARY ANGIOGRAM N/A 01/07/2013   Procedure: LEFT HEART CATHETERIZATION WITH CORONARY ANGIOGRAM;  Surgeon: Minus Breeding, MD;  Location: [facility] CATH LAB;  Service: Cardiovascular;  Laterality: N/A;  . PVC ablation  04/14/13   parahisian PVC focus ablated by Dr Rayann Heman  . SUPRAVENTRICULAR TACHYCARDIA ABLATION N/A 04/14/2013   Procedure: VT Ablation;  Surgeon: Thompson Grayer, MD;  Location: [facility] CATH LAB;  Service: Cardiovascular;  Laterality: N/A;    ROS- all systems are reviewed and negatives except as per HPI above  Current Outpatient Prescriptions  Medication Sig Dispense Refill  . acetaminophen (TYLENOL) 500 MG tablet Take 2 tablets by mouth every 8-12 hours as needed for pain/headache    . aspirin EC 81 MG tablet Take 81 mg by mouth daily.    . cetirizine (ZYRTEC) 10 MG tablet Take 10 mg by mouth daily as needed for allergies.    Marland Kitchen gabapentin (NEURONTIN) 800 MG tablet Take 1 tablet (800 mg total) by mouth 2 (two) times daily. 180 tablet 2  . lisinopril (PRINIVIL,ZESTRIL)  20 MG tablet TAKE ONE TABLET BY MOUTH AT BEDTIME 30 tablet 2  . naproxen (NAPROSYN) 250 MG tablet Take 500 mg by mouth 2 (two) times daily as needed (pain/headache).     . pantoprazole (PROTONIX) 40 MG tablet Take 40 mg by mouth at bedtime.     . rosuvastatin (CRESTOR) 5 MG tablet Take 5 mg by mouth at bedtime.     Marland Kitchen testosterone (TESTIM) 50 MG/5GM (1%) GEL Place 5 g onto the skin daily. 150 g 3  . verapamil (CALAN-SR) 240 MG CR tablet Take 1 tablet (240 mg total) by mouth daily. 90 tablet 3   No current facility-administered medications for this visit.     Physical Exam: Vitals:   03/15/17 0824  BP: (!) 138/99  Pulse: 75  SpO2: 97%  Weight: 196 lb (88.9 kg)  Height: 5\' 8"  (1.727 m)    GEN- The patient is well appearing, alert and oriented x 3 today.   Head- normocephalic, atraumatic Eyes-  Sclera clear, conjunctiva pink Ears- hearing intact Oropharynx- clear Lungs- Clear to ausculation bilaterally, normal work of breathing Heart- Regular rate and rhythm, no murmurs, rubs or gallops, PMI not laterally displaced GI- soft, NT, ND, + BS Extremities- no clubbing, cyanosis, or edema   Assessment and Plan:  1. PVCs Stable with verapamil 240mg  daily I dont think we could improve this further presently No changes  2. HTN Stable Avoid NSAIDs and sodium No change required today  Return to see me in 9 months  Thompson Grayer MD, [facility] 03/15/2017 8:48 AM

## 2017-03-15 NOTE — Patient Instructions (Signed)
Medication Instructions:  Continue all current medications.  Labwork: none  Testing/Procedures: none  Follow-Up: Your physician wants you to follow up in: 9 months.  You will receive a reminder letter in the mail one-two months in advance.  If you don't receive a letter, please call our office to schedule the follow up appointment   Any Other Special Instructions Will Be Listed Below (If Applicable).  If you need a refill on your cardiac medications before your next appointment, please call your pharmacy.  

## 2017-03-25 ENCOUNTER — Encounter (INDEPENDENT_AMBULATORY_CARE_PROVIDER_SITE_OTHER): Payer: Self-pay | Admitting: *Deleted

## 2017-03-29 ENCOUNTER — Telehealth (HOSPITAL_COMMUNITY): Payer: Self-pay | Admitting: *Deleted

## 2017-03-29 NOTE — Telephone Encounter (Signed)
Office received ref to sch new pt appt from Dr. Rory Percy office at [place]. Called pt on home number at 2:21pm and mobile number 2:23pm and was unable to reach him. Staff lmtcb and office number was provided.

## 2017-04-23 NOTE — Progress Notes (Signed)
Psychiatric Initial Adult Assessment   Patient Identification: Anthony Sherman MRN:  [id number] Date of Evaluation:  04/29/2017 Referral Source: Dr. Rory Percy office at [place] Chief Complaint:   Chief Complaint    Psychiatric Evaluation    "I would love to go out (if he does not have heart condition)" Visit Diagnosis:    ICD-10-CM   1. Adjustment disorder with depressed mood F43.21     History of Present Illness:   Anthony Sherman is a 56 year old male with fibromyalgia, PVC s/p ablation, hypertension, POTS, testicular hypofunction, who is referred for "cognitive evaluation per citizens disability." Per chart review, elaboration of reason for referral is not available.   He presented 20 mins late for the appointment. He states that he is here for disability paperwork. He is hoping to get disability for fibromyalgia and arrhythmia. He states that he has arrhythmia for the past few weeks, and is waiting for pacemaker to be implanted. He states that he feels palpitation now by talking. He feels frustrated that he has not been able to be with his grandchildren (age 39 years, 9 months) due to his palpitation/bradycardia. He had a few episodes of presyncope due to bradycardia. Although he used to enjoy playing golf and softball, he has not been able to do it due to physical condition. He would "love to go out" and be around with people. He talks about her friend with fibromyalgia, who committed suicide. Although he would never kill himself as he has "too much to live for," he understands how it feels like to be with pain. He feels frustrated towards people who does not see his pain. He also talks about financial strain; his wife who used to be a nurse suffered stroke in Oct 2017.   He feels depressed at times and could not go out of bed due to pain. He has insomnia with night time awakening due to pain. He reports decreased concentration and memory at times. He has passive SI, feeling  overwhelmed with pain. He feels anxious at times. He denies panic attacks, although it is difficult to discern as he has arrhythmia. She denies decreased need for sleep or euphoria. He denies alcohol use or drug use.   Per PMP Anthony Sherman 1% only  Associated Signs/Symptoms: Depression Symptoms:  depressed mood, insomnia, hopelessness, (Hypo) Manic Symptoms:  denies Anxiety Symptoms:  mild anxiety Psychotic Symptoms:  denies PTSD Symptoms: NA  Past Psychiatric History:  Outpatient: denies Psychiatry admission: denies Previous suicide attempt: denies Past trials of medication: denies History of violence: denies  Previous Psychotropic Medications: No   Substance Abuse History in the last 12 months:  No.  Consequences of Substance Abuse: NA  Past Medical History:  Past Medical History:  Diagnosis Date  . Essential hypertension, benign   . Fibromyalgia   . GERD (gastroesophageal reflux disease)   . Mixed hyperlipidemia   . POTS (postural orthostatic tachycardia syndrome)   . Premature ventricular contractions   . Syncope    2010    Past Surgical History:  Procedure Laterality Date  . APPENDECTOMY  ~ 2000  . CHOLECYSTECTOMY  ~ 2000  . LEFT HEART CATHETERIZATION WITH CORONARY ANGIOGRAM N/A 01/07/2013   Procedure: LEFT HEART CATHETERIZATION WITH CORONARY ANGIOGRAM;  Surgeon: Minus Breeding, MD;  Location: [facility] CATH LAB;  Service: Cardiovascular;  Laterality: N/A;  . PVC ablation  04/14/13   parahisian PVC focus ablated by Dr Rayann Heman  . SUPRAVENTRICULAR TACHYCARDIA ABLATION N/A 04/14/2013   Procedure: VT  Ablation;  Surgeon: Thompson Grayer, MD;  Location: [facility] CATH LAB;  Service: Cardiovascular;  Laterality: N/A;    Family Psychiatric History:  denies  Family History:  Family History  Problem Relation Age of Onset  . CAD Mother   . Rheum arthritis Mother   . Colon cancer Father   . Atrial fibrillation Father   . Breast cancer Sister   . Lung cancer Sister   . Heart disease  Maternal Grandfather   . Scleroderma Brother     Social History:   Social History   Social History  . Marital status: Married    Spouse name: N/A  . Number of children: 2  . Years of education: Some college   Occupational History  . N/A    Social History Main Topics  . Smoking status: Never Smoker  . Smokeless tobacco: Never Used  . Alcohol use No  . Drug use: No  . Sexual activity: Yes    Partners: Female     Comment: Married   Other Topics Concern  . 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 Flecainide     SOB 2014  . Multaq [Dronedarone]     Nightmares    Metabolic Disorder Labs: No results found for: HGBA1C, MPG No results found for: PROLACTIN No results found for: CHOL, TRIG, HDL, CHOLHDL, VLDL, LDLCALC   Current Medications: Current Outpatient Prescriptions  Medication Sig Dispense Refill  . acetaminophen (TYLENOL) 500 MG tablet Take 2 tablets by mouth every 8-12 hours as needed for pain/headache    . aspirin EC 81 MG tablet Take 81 mg by mouth daily.    . cetirizine (ZYRTEC) 10 MG tablet Take 10 mg by mouth daily as needed for allergies.    Marland Kitchen gabapentin (NEURONTIN) 800 MG tablet Take 1 tablet (800 mg total) by mouth 2 (two) times daily. 180 tablet 2  . lisinopril (PRINIVIL,ZESTRIL) 20 MG tablet TAKE ONE TABLET BY MOUTH AT BEDTIME 30 tablet 2  . naproxen (NAPROSYN) 250 MG tablet Take 500 mg by mouth 2 (two) times daily as needed (pain/headache).     . pantoprazole (PROTONIX) 40 MG tablet Take 40 mg by mouth at bedtime.     . rosuvastatin (CRESTOR) 5 MG tablet Take 5 mg by mouth at bedtime.     Marland Kitchen testosterone (Anthony Sherman) 50 MG/5GM (1%) GEL Place 5 g onto the skin daily.  150 g 3  . verapamil (CALAN-SR) 240 MG CR tablet Take 1 tablet (240 mg total) by mouth daily. 90 tablet 3  . DULoxetine (CYMBALTA) 30 MG capsule Take 1 capsule (30 mg total) by mouth daily. 30 capsule 1   No current facility-administered medications for this visit.     Neurologic: Headache: No Seizure: No Paresthesias:No  Musculoskeletal: Strength & Muscle Tone: within normal limits Gait & Station: normal Patient leans: N/A  Psychiatric Specialty Exam: Review of Systems  Constitutional: Positive for malaise/fatigue.  Cardiovascular: Positive for palpitations.  Psychiatric/Behavioral: Positive for depression and suicidal ideas. Negative for hallucinations and substance abuse. The patient is nervous/anxious and has insomnia.   All other systems reviewed and are negative.   Blood pressure Marland Kitchen)  134/92, pulse 77, height 5\' 8"  (1.727 m), weight 196 lb 9.6 oz (89.2 kg).Body mass index is 29.89 kg/m.  General Appearance: Fairly Groomed  Eye Contact:  Good  Speech:  Clear and Coherent  Volume:  Normal  Mood:  Depressed  Affect:  Appropriate, Congruent, Full Range and slightly down  Thought Process:  Coherent and Goal Directed  Orientation:  Full (Time, Place, and Person)  Thought Content:  Logical Perceptions: denies AH/VH  Suicidal Thoughts:  Yes.  without intent/plan  Homicidal Thoughts:  No  Memory:  Immediate;   Good Recent;   Good Remote;   Good  Judgement:  Good  Insight:  Fair  Psychomotor Activity:  Normal  Concentration:  Concentration: Good and Attention Span: Good  Recall:  Good  Fund of Knowledge:Fair  Language: Good  Akathisia:  No  Handed:  Right  AIMS (if indicated):  N/A  Assets:  Communication Skills Desire for Improvement  ADL's:  Intact  Cognition: WNL  Sleep:  poor  Delayed recall 3/3, drawing clock 3/3  Assessment Anthony Sherman is a 56 year old male with fibromyalgia, PVC s/p ablation, hypertension, POTS, testicular hypofunction, who is referred  for "cognitive evaluation per citizens disability." Per chart review, elaboration of reason for referral is not available.   # Adjustment disorder with depressed mood Exam is notable for his preserved mood reactivity, and he endorses mild neurovegetative symptoms secondary to his cardiac condition, generalized pain and financial strain. Will start duloxetine to target both mood symptoms and fibromyalgia. Dicussed risk of hypertension. Although he will greatly benefit from therapy given demoralization, he declines this due to financial strain. Will continue to discuss as needed. Noted that although he reports mild memory loss and difficulty with concentration, it may be situational due to his mood; no significant deficits observed on evaluation.   Plan 1. Start duloxetine 30 mg daily 2. Return to clinic in one month for 30 mins 3. Consider TSH at the next encounter - Will file the paperwork for disability  The patient demonstrates the following risk factors for suicide: Chronic risk factors for suicide include: chronic pain. Acute risk factors for suicide include: unemployment. Protective factors for this patient include: positive social support, responsibility to others (children, family), coping skills and hope for the future. Considering these factors, the overall suicide risk at this point appears to be low. Patient is appropriate for outpatient follow up.   Treatment Plan Summary: Medication management   Norman Clay, MD 9/24/201810:30 AM

## 2017-04-29 ENCOUNTER — Ambulatory Visit (INDEPENDENT_AMBULATORY_CARE_PROVIDER_SITE_OTHER): Payer: No Typology Code available for payment source | Admitting: Psychiatry

## 2017-04-29 ENCOUNTER — Encounter (HOSPITAL_COMMUNITY): Payer: Self-pay | Admitting: Psychiatry

## 2017-04-29 VITALS — BP 134/92 | HR 77 | Ht 68.0 in | Wt 196.6 lb

## 2017-04-29 DIAGNOSIS — Z79899 Other long term (current) drug therapy: Secondary | ICD-10-CM

## 2017-04-29 DIAGNOSIS — F4321 Adjustment disorder with depressed mood: Secondary | ICD-10-CM | POA: Diagnosis not present

## 2017-04-29 DIAGNOSIS — G4700 Insomnia, unspecified: Secondary | ICD-10-CM

## 2017-04-29 DIAGNOSIS — I10 Essential (primary) hypertension: Secondary | ICD-10-CM | POA: Diagnosis not present

## 2017-04-29 DIAGNOSIS — M797 Fibromyalgia: Secondary | ICD-10-CM

## 2017-04-29 DIAGNOSIS — I498 Other specified cardiac arrhythmias: Secondary | ICD-10-CM | POA: Diagnosis not present

## 2017-04-29 MED ORDER — DULOXETINE HCL 30 MG PO CPEP: 30.0000 mg | ORAL_CAPSULE | Freq: Every day | ORAL | 1 refills | Status: DC

## 2017-04-29 NOTE — Patient Instructions (Signed)
1. Start duloxetine 30 mg daily 2. Return to clinic in one month for 30 mins

## 2017-05-07 ENCOUNTER — Telehealth (HOSPITAL_COMMUNITY): Payer: Self-pay | Admitting: *Deleted

## 2017-05-07 NOTE — Telephone Encounter (Signed)
Pt came by office to pick up his disability paperwork that he needs for court tomorrow 05-08-2017. Pt D/L number is [phone] and exp date is 07-19-22. A copy of form was sent to scan center.

## 2017-05-08 ENCOUNTER — Other Ambulatory Visit: Payer: Self-pay | Admitting: Internal Medicine

## 2017-05-15 ENCOUNTER — Telehealth: Payer: Self-pay | Admitting: *Deleted

## 2017-05-15 NOTE — Telephone Encounter (Signed)
Submitted PA testim on covermymeds on 05/08/17 [id number].  Called today to check on status. Spoke with Thayer Jew. He stated that med is a plan exclusion. Pt only has basic med coverage. He has coverage for preventative meds, contracaeptives and immunizations. Testim is not included.

## 2017-05-15 NOTE — Telephone Encounter (Signed)
I called patient. The patient has changed his insurance plans, the testosterone supplementation is no longer covered. He does not believe that the medications been helpful in improving his symptoms of fatigue.  We will stop the medication therefore. The patient has been placed on low-dose Cymbalta which has helped his fibromyalgia symptoms.

## 2017-05-24 NOTE — Progress Notes (Deleted)
BH MD/PA/NP OP Progress Note  05/24/2017 10:04 AM Anthony Sherman  MRN:  [id number]  Chief Complaint:  HPI:  - Per chart review, testosterone was discontinued as it is not covered by insurance.   Visit Diagnosis: No diagnosis found.  Past Psychiatric History:  I have reviewed the patient's psychiatry history in detail and updated the patient record. Outpatient: denies Psychiatry admission: denies Previous suicide attempt: denies Past trials of medication: denies History of violence: denies  Past Medical History:  Past Medical History:  Diagnosis Date  . Essential hypertension, benign   . Fibromyalgia   . GERD (gastroesophageal reflux disease)   . Mixed hyperlipidemia   . POTS (postural orthostatic tachycardia syndrome)   . Premature ventricular contractions   . Syncope    2010    Past Surgical History:  Procedure Laterality Date  . APPENDECTOMY  ~ 2000  . CHOLECYSTECTOMY  ~ 2000  . LEFT HEART CATHETERIZATION WITH CORONARY ANGIOGRAM N/A 01/07/2013   Procedure: LEFT HEART CATHETERIZATION WITH CORONARY ANGIOGRAM;  Surgeon: Minus Breeding, MD;  Location: [facility] CATH LAB;  Service: Cardiovascular;  Laterality: N/A;  . PVC ablation  04/14/13   parahisian PVC focus ablated by Dr Rayann Heman  . SUPRAVENTRICULAR TACHYCARDIA ABLATION N/A 04/14/2013   Procedure: VT Ablation;  Surgeon: Thompson Grayer, MD;  Location: [facility] CATH LAB;  Service: Cardiovascular;  Laterality: N/A;    Family Psychiatric History:  I have reviewed the patient's family history in detail and updated the patient record.  Family History:  Family History  Problem Relation Age of Onset  . CAD Mother   . Rheum arthritis Mother   . Colon cancer Father   . Atrial fibrillation Father   . Breast cancer Sister   . Lung cancer Sister   . Heart disease Maternal Grandfather   . Scleroderma Brother     Social History:  Social History   Social History  . Marital status: Married    Spouse name: N/A  . Number of children: 2  .  Years of education: Some college   Occupational History  . N/A    Social History Main Topics  . Smoking status: Never Smoker  . Smokeless tobacco: Never Used  . Alcohol use No  . Drug use: No  . Sexual activity: Yes    Partners: Female     Comment: Married   Other Topics Concern  . 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 Flecainide     SOB 2014  . Multaq [Dronedarone]     Nightmares    Metabolic Disorder Labs: No results found for: HGBA1C, MPG No results found for: PROLACTIN No results found for: CHOL, TRIG, HDL, CHOLHDL, VLDL, LDLCALC No results found for: TSH  Therapeutic Level Labs: No results found for: LITHIUM No results found for: VALPROATE No components found for:  CBMZ  Current Medications: Current Outpatient Prescriptions  Medication Sig Dispense Refill  . acetaminophen (TYLENOL) 500 MG tablet Take 2 tablets by mouth every 8-12 hours as needed for pain/headache    . aspirin EC 81 MG tablet Take 81 mg by mouth daily.    . cetirizine (ZYRTEC) 10 MG tablet Take 10 mg by  mouth daily as needed for allergies.    . DULoxetine (CYMBALTA) 30 MG capsule Take 1 capsule (30 mg total) by mouth daily. 30 capsule 1  . gabapentin (NEURONTIN) 800 MG tablet Take 1 tablet (800 mg total) by mouth 2 (two) times daily. 180 tablet 2  . lisinopril (PRINIVIL,ZESTRIL) 20 MG tablet TAKE ONE TABLET BY MOUTH AT BEDTIME 30 tablet 2  . naproxen (NAPROSYN) 250 MG tablet Take 500 mg by mouth 2 (two) times daily as needed (pain/headache).     . pantoprazole (PROTONIX) 40 MG tablet Take 40 mg by mouth at bedtime.     . rosuvastatin (CRESTOR) 5 MG tablet Take 5 mg by mouth at bedtime.     . verapamil (CALAN-SR) 240 MG CR tablet  TAKE ONE TABLET BY MOUTH DAILY. 90 tablet 3   No current facility-administered medications for this visit.      Musculoskeletal: Strength & Muscle Tone: within normal limits Gait & Station: normal Patient leans: N/A  Psychiatric Specialty Exam: ROS  There were no vitals taken for this visit.There is no height or weight on file to calculate BMI.  General Appearance: Fairly Groomed  Eye Contact:  Good  Speech:  Clear and Coherent  Volume:  Normal  Mood:  {BHH MOOD:22306}  Affect:  {Affect (PAA):22687}  Thought Process:  Coherent and Goal Directed  Orientation:  Full (Time, Place, and Person)  Thought Content: Logical   Suicidal Thoughts:  {ST/HT (PAA):22692}  Homicidal Thoughts:  {ST/HT (PAA):22692}  Memory:  Immediate;   Good Recent;   Good Remote;   Good  Judgement:  {Judgement (PAA):22694}  Insight:  {Insight (PAA):22695}  Psychomotor Activity:  Normal  Concentration:  Concentration: Good and Attention Span: Good  Recall:  Good  Fund of Knowledge: Good  Language: Good  Akathisia:  No  Handed:  Right  AIMS (if indicated): not done  Assets:  Communication Skills Desire for Improvement  ADL's:  Intact  Cognition: WNL  Sleep:  {BHH GOOD/FAIR/POOR:22877}   Screenings: Delayed recall 3/3, drawing clock 3/3 o 9/24  Assessment and Plan:  Anthony Sherman is a 56 y.o. year old male with a history of adjustment disorder, fibromyalgia,  PVC s/p ablation, hypertension, POTS, testicular hypofunction, who presents for follow up appointment for No diagnosis found. He was originally referred for "cognitive evaluation per citizens disability." Per chart review, elaboration of reason for referral is not available.   # Adjustment disorder with depressed mood  Exam is notable for his preserved mood reactivity, and he endorses mild neurovegetative symptoms secondary to his cardiac condition, generalized pain and financial strain. Will start duloxetine to target both mood symptoms and  fibromyalgia. Dicussed risk of hypertension. Although he will greatly benefit from therapy given demoralization, he declines this due to financial strain. Will continue to discuss as needed. Noted that although he reports mild memory loss and difficulty with concentration, it may be situational due to his mood; no significant deficits observed on evaluation.   Plan 1. Start duloxetine 30 mg daily 2. Return to clinic in one month for 30 mins 3. Consider TSH at the next encounter - Will file the paperwork for disability  The patient demonstrates the following risk factors for suicide: Chronic risk factors for suicide include: chronic pain. Acute risk factors for suicide include: unemployment. Protective factors for this patient include: positive social support, responsibility to others (children, family), coping skills and hope for the future. Considering these factors, the overall suicide risk at this point appears to  be low. Patient is appropriate for outpatient follow up.   Norman Clay, MD 05/24/2017, 10:04 AM

## 2017-05-29 ENCOUNTER — Ambulatory Visit (HOSPITAL_COMMUNITY): Payer: Self-pay | Admitting: Psychiatry

## 2017-05-31 ENCOUNTER — Telehealth (HOSPITAL_COMMUNITY): Payer: Self-pay | Admitting: *Deleted

## 2017-05-31 NOTE — Telephone Encounter (Signed)
Pt pharmacy faxed request for refills for pt Duloxetine HCL DR 30 mg QD. Per pt chart, pt medication was last filled on 04-29-2017 with 30 mg with 1 refill. Pt pharmacy number is [phone].

## 2017-06-03 NOTE — Telephone Encounter (Signed)
He has refill

## 2017-06-03 NOTE — Telephone Encounter (Signed)
noted 

## 2017-06-26 ENCOUNTER — Other Ambulatory Visit: Payer: Self-pay | Admitting: *Deleted

## 2017-06-26 MED ORDER — DULOXETINE HCL 30 MG PO CPEP: 30.0000 mg | ORAL_CAPSULE | Freq: Every day | ORAL | 1 refills | Status: DC

## 2017-08-09 ENCOUNTER — Other Ambulatory Visit: Payer: Self-pay | Admitting: Neurology

## 2017-08-27 ENCOUNTER — Other Ambulatory Visit: Payer: Self-pay | Admitting: Neurology

## 2017-08-30 ENCOUNTER — Ambulatory Visit: Payer: BLUE CROSS/BLUE SHIELD | Admitting: Neurology

## 2017-08-30 ENCOUNTER — Encounter: Payer: Self-pay | Admitting: Neurology

## 2017-08-30 ENCOUNTER — Encounter (INDEPENDENT_AMBULATORY_CARE_PROVIDER_SITE_OTHER): Payer: Self-pay

## 2017-08-30 VITALS — BP 134/89 | HR 87 | Ht 68.0 in | Wt 198.5 lb

## 2017-08-30 DIAGNOSIS — M797 Fibromyalgia: Secondary | ICD-10-CM | POA: Diagnosis not present

## 2017-08-30 DIAGNOSIS — F5104 Psychophysiologic insomnia: Secondary | ICD-10-CM

## 2017-08-30 HISTORY — DX: Psychophysiologic insomnia: F51.04

## 2017-08-30 MED ORDER — TRAZODONE HCL 100 MG PO TABS: 100.0000 mg | ORAL_TABLET | Freq: Every day | ORAL | 1 refills | Status: DC

## 2017-08-30 MED ORDER — GABAPENTIN 300 MG PO CAPS: 300.0000 mg | ORAL_CAPSULE | Freq: Every day | ORAL | 1 refills | Status: DC | PRN

## 2017-08-30 NOTE — Patient Instructions (Signed)
   Take gabapentin 300 mg if needed.  Take trazodone 100 mg at night for sleep.

## 2017-08-30 NOTE — Progress Notes (Signed)
Reason for visit: Fibromyalgia  Anthony Sherman is an 57 y.o. male  History of present illness:  Anthony Sherman is a 57 year old right-handed white male with a history of fibromyalgia.  The patient was noted to have a low testosterone level, but testosterone supplementation did not offer any benefit for him.  He is on gabapentin taking 800 mg twice daily, he finds that on cold or wet days he has increased pain.  The patient has been placed on Cymbalta taking 30 mg daily, he finds that this has been helpful.  The patient is trying to maintain a low level of physical activity.  He has noted episodes of night sweats that may occur with some increasing frequency.  The patient has had these events for several years.  The patient returns to the office today for an evaluation.  He wonders if hemp oil might help his symptoms.  Past Medical History:  Diagnosis Date  . Chronic insomnia 08/30/2017  . Essential hypertension, benign   . Fibromyalgia   . GERD (gastroesophageal reflux disease)   . Mixed hyperlipidemia   . POTS (postural orthostatic tachycardia syndrome)   . Premature ventricular contractions   . Syncope    2010    Past Surgical History:  Procedure Laterality Date  . APPENDECTOMY  ~ 2000  . CHOLECYSTECTOMY  ~ 2000  . LEFT HEART CATHETERIZATION WITH CORONARY ANGIOGRAM N/A 01/07/2013   Procedure: LEFT HEART CATHETERIZATION WITH CORONARY ANGIOGRAM;  Surgeon: Minus Breeding, MD;  Location: [facility] CATH LAB;  Service: Cardiovascular;  Laterality: N/A;  . PVC ablation  04/14/13   parahisian PVC focus ablated by Dr Rayann Heman  . SUPRAVENTRICULAR TACHYCARDIA ABLATION N/A 04/14/2013   Procedure: VT Ablation;  Surgeon: Thompson Grayer, MD;  Location: [facility] CATH LAB;  Service: Cardiovascular;  Laterality: N/A;    Family History  Problem Relation Age of Onset  . CAD Mother   . Rheum arthritis Mother   . Colon cancer Father   . Atrial fibrillation Father   . Breast cancer Sister   . Lung cancer Sister   .  Heart disease Maternal Grandfather   . Scleroderma Brother     Social history:  reports that  has never smoked. he has never used smokeless tobacco. He reports that he does not drink alcohol or use drugs.    Allergies  Allergen Reactions  . Flecainide     SOB 2014  . Multaq [Dronedarone]     Nightmares    Medications:  Prior to Admission medications   Medication Sig Start Date End Date Taking? Authorizing Provider  acetaminophen (TYLENOL) 500 MG tablet Take 2 tablets by mouth every 8-12 hours as needed for pain/headache   Yes [provider]  aspirin EC 81 MG tablet Take 81 mg by mouth daily.   Yes [provider]  cetirizine (ZYRTEC) 10 MG tablet Take 10 mg by mouth daily as needed for allergies.   Yes [provider]  DULoxetine (CYMBALTA) 30 MG capsule TAKE ONE CAPSULE BY MOUTH DAILY. 08/27/17  Yes Kathrynn Ducking, MD  gabapentin (NEURONTIN) 800 MG tablet TAKE ONE TABLET BY MOUTH TWICE DAILY 08/09/17  Yes Kathrynn Ducking, MD  lisinopril (PRINIVIL,ZESTRIL) 20 MG tablet TAKE ONE TABLET BY MOUTH AT BEDTIME 07/19/14  Yes Allred, Jeneen Rinks, MD  naproxen (NAPROSYN) 250 MG tablet Take 500 mg by mouth 2 (two) times daily as needed (pain/headache).    Yes [provider]  pantoprazole (PROTONIX) 40 MG tablet Take 40 mg by  mouth at bedtime.    Yes [provider]  rosuvastatin (CRESTOR) 5 MG tablet Take 5 mg by mouth at bedtime.    Yes [provider]  verapamil (CALAN-SR) 240 MG CR tablet TAKE ONE TABLET BY MOUTH DAILY. 05/08/17  Yes Allred, Jeneen Rinks, MD  gabapentin (NEURONTIN) 300 MG capsule Take 1 capsule (300 mg total) by mouth daily as needed. 08/30/17   Kathrynn Ducking, MD  traZODone (DESYREL) 100 MG tablet Take 1 tablet (100 mg total) by mouth at bedtime. 08/30/17   Kathrynn Ducking, MD    ROS:  Out of a complete 14 system review of symptoms, the patient complains only of the following symptoms, and all other reviewed systems are  negative.  Muscle pain  Blood pressure 134/89, pulse 87, height 5\' 8"  (1.727 m), weight 198 lb 8 oz (90 kg).  Physical Exam  General: The patient is alert and cooperative at the time of the examination.  Skin: No significant peripheral edema is noted.   Neurologic Exam  Mental status: The patient is alert and oriented x 3 at the time of the examination. The patient has apparent normal recent and remote memory, with an apparently normal attention span and concentration ability.   Cranial nerves: Facial symmetry is present. Speech is normal, no aphasia or dysarthria is noted. Extraocular movements are full.  With superior gaze there is exotropia of the right eye.  Visual fields are full.  Motor: The patient has good strength in all 4 extremities.  Sensory examination: Soft touch sensation is symmetric on the face, arms, and legs.  Coordination: The patient has good finger-nose-finger and heel-to-shin bilaterally.  Gait and station: The patient has a normal gait. Tandem gait is normal. Romberg is negative. No drift is seen.  Reflexes: Deep tendon reflexes are symmetric.   Assessment/Plan:  1.  Fibromyalgia  2.  Chronic insomnia  The patient indicates that he is only sleeping a couple hours of uninterrupted sleep at night.  The patient is having significant issues in this regard.  We will add trazodone 100 mg at night.  He will call for any dose adjustments.  The patient will be given a prescription for gabapentin 300 mg capsules to take if needed on days where the pain is more significant.  The patient will continue the 800 mg gabapentin capsules and he will follow-up in 6 months.  He will continue the Cymbalta.  Jill Alexanders MD 08/30/2017 9:32 AM  [facility] [address] [place], [place] [address]  Phone [phone] Fax [phone]

## 2017-10-01 ENCOUNTER — Encounter (INDEPENDENT_AMBULATORY_CARE_PROVIDER_SITE_OTHER): Payer: Self-pay | Admitting: *Deleted

## 2017-10-24 ENCOUNTER — Other Ambulatory Visit: Payer: Self-pay | Admitting: Neurology

## 2017-11-25 ENCOUNTER — Encounter (INDEPENDENT_AMBULATORY_CARE_PROVIDER_SITE_OTHER): Payer: Self-pay | Admitting: Family Medicine

## 2017-11-25 ENCOUNTER — Encounter (INDEPENDENT_AMBULATORY_CARE_PROVIDER_SITE_OTHER): Payer: Self-pay

## 2017-11-29 ENCOUNTER — Other Ambulatory Visit (INDEPENDENT_AMBULATORY_CARE_PROVIDER_SITE_OTHER): Payer: Self-pay | Admitting: *Deleted

## 2017-11-29 DIAGNOSIS — Z86010 Personal history of colonic polyps: Secondary | ICD-10-CM

## 2017-11-29 DIAGNOSIS — Z800 Family history of malignant neoplasm of digestive organs: Secondary | ICD-10-CM | POA: Insufficient documentation

## 2018-01-14 DIAGNOSIS — R5383 Other fatigue: Secondary | ICD-10-CM | POA: Diagnosis not present

## 2018-01-14 DIAGNOSIS — R000 Tachycardia, unspecified: Secondary | ICD-10-CM | POA: Diagnosis not present

## 2018-01-14 DIAGNOSIS — I10 Essential (primary) hypertension: Secondary | ICD-10-CM | POA: Diagnosis not present

## 2018-01-14 DIAGNOSIS — E7800 Pure hypercholesterolemia, unspecified: Secondary | ICD-10-CM | POA: Diagnosis not present

## 2018-01-14 DIAGNOSIS — K219 Gastro-esophageal reflux disease without esophagitis: Secondary | ICD-10-CM | POA: Diagnosis not present

## 2018-01-14 DIAGNOSIS — M797 Fibromyalgia: Secondary | ICD-10-CM | POA: Diagnosis not present

## 2018-01-23 ENCOUNTER — Other Ambulatory Visit: Payer: Self-pay | Admitting: Neurology

## 2018-01-23 DIAGNOSIS — Z6829 Body mass index (BMI) 29.0-29.9, adult: Secondary | ICD-10-CM | POA: Diagnosis not present

## 2018-01-23 DIAGNOSIS — R5383 Other fatigue: Secondary | ICD-10-CM | POA: Diagnosis not present

## 2018-01-23 DIAGNOSIS — M797 Fibromyalgia: Secondary | ICD-10-CM | POA: Diagnosis not present

## 2018-01-23 DIAGNOSIS — R000 Tachycardia, unspecified: Secondary | ICD-10-CM | POA: Diagnosis not present

## 2018-01-23 DIAGNOSIS — K219 Gastro-esophageal reflux disease without esophagitis: Secondary | ICD-10-CM | POA: Diagnosis not present

## 2018-01-23 DIAGNOSIS — E7800 Pure hypercholesterolemia, unspecified: Secondary | ICD-10-CM | POA: Diagnosis not present

## 2018-01-23 DIAGNOSIS — I10 Essential (primary) hypertension: Secondary | ICD-10-CM | POA: Diagnosis not present

## 2018-02-03 ENCOUNTER — Telehealth (INDEPENDENT_AMBULATORY_CARE_PROVIDER_SITE_OTHER): Payer: Self-pay | Admitting: *Deleted

## 2018-02-03 ENCOUNTER — Encounter (INDEPENDENT_AMBULATORY_CARE_PROVIDER_SITE_OTHER): Payer: Self-pay | Admitting: *Deleted

## 2018-02-03 MED ORDER — PEG 3350-KCL-NA BICARB-NACL 420 G PO SOLR: 4000.0000 mL | Freq: Once | ORAL | 0 refills | Status: AC

## 2018-02-03 NOTE — Telephone Encounter (Signed)
Patient needs trilyte 

## 2018-02-07 ENCOUNTER — Ambulatory Visit: Payer: Self-pay | Admitting: Internal Medicine

## 2018-02-10 ENCOUNTER — Ambulatory Visit (INDEPENDENT_AMBULATORY_CARE_PROVIDER_SITE_OTHER): Payer: Medicare HMO | Admitting: Internal Medicine

## 2018-02-10 ENCOUNTER — Encounter (INDEPENDENT_AMBULATORY_CARE_PROVIDER_SITE_OTHER): Payer: Self-pay | Admitting: Internal Medicine

## 2018-02-10 VITALS — BP 148/86 | HR 68 | Temp 98.5°F | Ht 68.0 in | Wt 184.5 lb

## 2018-02-10 DIAGNOSIS — Z860100 Personal history of colon polyps, unspecified: Secondary | ICD-10-CM

## 2018-02-10 DIAGNOSIS — Z800 Family history of malignant neoplasm of digestive organs: Secondary | ICD-10-CM | POA: Insufficient documentation

## 2018-02-10 DIAGNOSIS — K580 Irritable bowel syndrome with diarrhea: Secondary | ICD-10-CM | POA: Diagnosis not present

## 2018-02-10 DIAGNOSIS — R634 Abnormal weight loss: Secondary | ICD-10-CM

## 2018-02-10 DIAGNOSIS — K219 Gastro-esophageal reflux disease without esophagitis: Secondary | ICD-10-CM | POA: Diagnosis not present

## 2018-02-10 NOTE — Patient Instructions (Signed)
Will schedule and EGD

## 2018-02-10 NOTE — Progress Notes (Signed)
Subjective:    Patient ID: Anthony Sherman, male    DOB: [birth year], 57 y.o.   MRN: [id number]  HPI Presents today with c/o that within 30 minutes he is in the BR with diarrhea. He says he has lost about 20 pounds in the past 3 months. He is having a BM 3-4 times a day. Usually has a BM every other day. He says he is having acid reflux. Acid reflux is worse at night when he lies down.  He sleeps on 2-3 pillows.  Has been on Protonix for at least 12 yrs.  Appetite is okay. He doesn't eat a lot during the day because of the BMs.  Wt in September 2018 196. Today his weight is 184.5.  Wife is a Therapist, sports and she wants patient to have an EGD.  Scheduled for a colonoscopy in August for Family hx of colon cancer, Hx of colon polyps.  His last colonoscopy was in 2010 average risk. Father had colon cancer in his early 80s. Two small polyps snared.  Biopsy: Two were tubular adenoma. One benign colonic polyp.   Hx of hypertension, PVC. Fibromyalgia Continues to have PVC's  Review of Systems Past Medical History:  Diagnosis Date  . Chronic insomnia 08/30/2017  . Essential hypertension, benign   . Fibromyalgia   . GERD (gastroesophageal reflux disease)   . Mixed hyperlipidemia   . POTS (postural orthostatic tachycardia syndrome)   . Premature ventricular contractions   . Syncope    2010    Past Surgical History:  Procedure Laterality Date  . APPENDECTOMY  ~ 2000  . CHOLECYSTECTOMY  ~ 2000  . LEFT HEART CATHETERIZATION WITH CORONARY ANGIOGRAM N/A 01/07/2013   Procedure: LEFT HEART CATHETERIZATION WITH CORONARY ANGIOGRAM;  Surgeon: Minus Breeding, MD;  Location: [facility] CATH LAB;  Service: Cardiovascular;  Laterality: N/A;  . PVC ablation  04/14/13   parahisian PVC focus ablated by Dr Rayann Heman  . SUPRAVENTRICULAR TACHYCARDIA ABLATION N/A 04/14/2013   Procedure: VT Ablation;  Surgeon: Thompson Grayer, MD;  Location[facility] CATH LAB;  Service: Cardiovascular;  Laterality: N/A;    Allergies  Allergen Reactions  .  Flecainide     SOB 2014  . Multaq [Dronedarone]     Nightmares    Current Outpatient Medications on File Prior to Visit  Medication Sig Dispense Refill  . aspirin EC 81 MG tablet Take 81 mg by mouth daily.    . DULoxetine (CYMBALTA) 30 MG capsule TAKE ONE CAPSULE BY MOUTH DAILY. 90 capsule 1  . gabapentin (NEURONTIN) 300 MG capsule TAKE ONE CAPSULE BY MOUTH DAILY AS NEEDED. (Patient taking differently: twice a day) 90 capsule 1  . gabapentin (NEURONTIN) 800 MG tablet TAKE ONE TABLET BY MOUTH TWICE DAILY 180 tablet 1  . lisinopril (PRINIVIL,ZESTRIL) 20 MG tablet TAKE ONE TABLET BY MOUTH AT BEDTIME 30 tablet 2  . naproxen (NAPROSYN) 250 MG tablet Take 500 mg by mouth 2 (two) times daily as needed (pain/headache).     . pantoprazole (PROTONIX) 40 MG tablet Take 40 mg by mouth at bedtime.     . rosuvastatin (CRESTOR) 5 MG tablet Take 5 mg by mouth at bedtime.     . traZODone (DESYREL) 100 MG tablet TAKE ONE TABLET BY MOUTH AT BEDTIME. 30 tablet 4  . verapamil (CALAN-SR) 240 MG CR tablet TAKE ONE TABLET BY MOUTH DAILY. 90 tablet 3   No current facility-administered medications on file prior to visit.         Objective:  Physical Exam Blood pressure (!) 148/86, pulse 68, temperature 98.5 F (36.9 C), height 5\' 8"  (1.727 m), weight 184 lb 8 oz (83.7 kg). Alert and oriented. Skin warm and dry. Oral mucosa is moist.   . Sclera anicteric, conjunctivae is pink. Thyroid not enlarged. No cervical lymphadenopathy. Lungs clear. Heart regular rate and rhythm.  Abdomen is soft. Bowel sounds are positive. No hepatomegaly. No abdominal masses felt. Epigastric tenderness.  No edema to lower extremities.           Assessment & Plan:  GERD. ? IBS.  Will schedule and EGD with the Colonoscopy.

## 2018-02-11 ENCOUNTER — Encounter: Payer: Self-pay | Admitting: Internal Medicine

## 2018-02-11 ENCOUNTER — Telehealth (INDEPENDENT_AMBULATORY_CARE_PROVIDER_SITE_OTHER): Payer: Self-pay | Admitting: *Deleted

## 2018-02-11 DIAGNOSIS — S61211A Laceration without foreign body of left index finger without damage to nail, initial encounter: Secondary | ICD-10-CM | POA: Diagnosis not present

## 2018-02-11 DIAGNOSIS — Z23 Encounter for immunization: Secondary | ICD-10-CM | POA: Diagnosis not present

## 2018-02-11 DIAGNOSIS — Z6829 Body mass index (BMI) 29.0-29.9, adult: Secondary | ICD-10-CM | POA: Diagnosis not present

## 2018-02-11 NOTE — Telephone Encounter (Signed)
Referring MD/PCP: howard   Procedure: tcs  Reason/Indication:  fam hx colon ca, hx polyps  Has patient had this procedure before?  Yes over 5 yrs ago  If so, when, by whom and where?    Is there a family history of colon cancer?  Yes, father  Who?  What age when diagnosed?    Is patient diabetic?   no      Does patient have prosthetic heart valve or mechanical valve?  no  Do you have a pacemaker?  no  Has patient ever had endocarditis? no  Has patient had joint replacement within last 12 months?  no  Is patient constipated or do they take laxatives? no  Does patient have a history of alcohol/drug use?  no  Is patient on blood thinner such as Coumadin, Plavix and/or Aspirin? yes  Medications: see epic  Allergies: see epic  Medication Adjustment per Dr Lindi Adie, NP: asa 2 days  Procedure date & time: 03/12/18 at 49

## 2018-02-11 NOTE — Telephone Encounter (Signed)
agree

## 2018-02-15 DIAGNOSIS — Z6829 Body mass index (BMI) 29.0-29.9, adult: Secondary | ICD-10-CM | POA: Diagnosis not present

## 2018-02-15 DIAGNOSIS — S40021A Contusion of right upper arm, initial encounter: Secondary | ICD-10-CM | POA: Diagnosis not present

## 2018-02-15 DIAGNOSIS — L0390 Cellulitis, unspecified: Secondary | ICD-10-CM | POA: Diagnosis not present

## 2018-02-21 ENCOUNTER — Other Ambulatory Visit: Payer: Self-pay | Admitting: Neurology

## 2018-02-27 ENCOUNTER — Encounter: Payer: Self-pay | Admitting: Adult Health

## 2018-02-27 ENCOUNTER — Ambulatory Visit (INDEPENDENT_AMBULATORY_CARE_PROVIDER_SITE_OTHER): Payer: Medicare HMO | Admitting: Adult Health

## 2018-02-27 VITALS — BP 118/77 | HR 83 | Ht 68.0 in | Wt 184.0 lb

## 2018-02-27 DIAGNOSIS — M797 Fibromyalgia: Secondary | ICD-10-CM | POA: Diagnosis not present

## 2018-02-27 MED ORDER — GABAPENTIN 300 MG PO CAPS: 300.0000 mg | ORAL_CAPSULE | Freq: Every day | ORAL | 1 refills | Status: DC | PRN

## 2018-02-27 MED ORDER — DULOXETINE HCL 30 MG PO CPEP: 30.0000 mg | ORAL_CAPSULE | Freq: Two times a day (BID) | ORAL | 5 refills | Status: DC

## 2018-02-27 NOTE — Patient Instructions (Signed)
Your Plan:  Continue gabapentin- ok to take 300-600 mg at lunch if needed Increase Cymbalta to 30 mg twice a day  Please be aware that trazodone and cymbalta together can sometimes cause a rare and potentially dangerous adverse reaction, called SEROTONIN SYNDROME: Symptoms of this condition include (but are not limited to):  Agitation or restlessness, confusion, rapid heart rate and high blood pressure, dilated pupils, loss of muscle coordination or twitching muscles, muscle rigidity/stiffness, sweating and/or flushing, diarrhea, headache, shivering, goose bumps. If you have any of these symptoms you may have to stop the medication. Call your health care provider immediately.  Severe serotonin syndrome can be life-threatening emergency. Signs and symptoms of a severe reaction may include: high fever, seizures, irregular heartbeat, unconsciousness or altered level of awareness or personality changes.  If you have any of these new symptoms, call 911 or have someone take you to the emergency room.      Thank you for coming to see [place] at [facility]. I hope we have been able to provide you high quality care today.  You may receive a patient satisfaction survey over the next few weeks. We would appreciate your feedback and comments so that we may continue to improve ourselves and the health of our patients.

## 2018-02-27 NOTE — Progress Notes (Signed)
I have read the note, and I agree with the clinical assessment and plan.  Anthony Sherman   

## 2018-02-27 NOTE — Progress Notes (Signed)
PATIENT: Anthony Sherman DOB: 12-Jul-1961  REASON FOR VISIT: follow up HISTORY FROM: patient  HISTORY OF PRESENT ILLNESS: Today 02/27/18:  [name] is a 57 year old male with a history of fibromyalgia.  He returns today for follow-up.  He states that the extra gabapentin has been beneficial.  He reports that he typically takes it at lunch.  There are times that he takes 600 mg instead of 300 and this tends to work well for him.  He states on a daily basis he has constant pain in his hands shoulders and legs.  He rates his pain a 5 out of 10.  He states at least twice a week his pain will increase to 8 out of 10.  He tries to maintain some physical activity.  He states that he still is not sleeping well at night despite being on trazodone.  He returns today for evaluation.  HISTORY 08/30/17: [name] is a 57 year old right-handed white male with a history of fibromyalgia.  The patient was noted to have a low testosterone level, but testosterone supplementation did not offer any benefit for him.  He is on gabapentin taking 800 mg twice daily, he finds that on cold or wet days he has increased pain.  The patient has been placed on Cymbalta taking 30 mg daily, he finds that this has been helpful.  The patient is trying to maintain a low level of physical activity.  He has noted episodes of night sweats that may occur with some increasing frequency.  The patient has had these events for several years.  The patient returns to the office today for an evaluation.  He wonders if hemp oil might help his symptoms.   REVIEW OF SYSTEMS: Out of a complete 14 system review of symptoms, the patient complains only of the following symptoms, and all other reviewed systems are negative.  Appetite change, cold intolerance, heat intolerance, shortness of breath, diarrhea, chest pain, joint pain, joint swelling, back pain, aching muscles, muscle cramps, rash, itching, agitation, dizziness, numbness,  bruise/bleed easily  ALLERGIES: Allergies  Allergen Reactions  . Flecainide     SOB 2014  . Multaq [Dronedarone]     Nightmares    HOME MEDICATIONS: Outpatient Medications Prior to Visit  Medication Sig Dispense Refill  . aspirin EC 81 MG tablet Take 81 mg by mouth daily.    Marland Kitchen gabapentin (NEURONTIN) 800 MG tablet TAKE ONE TABLET BY MOUTH TWICE DAILY 180 tablet 1  . lisinopril (PRINIVIL,ZESTRIL) 20 MG tablet TAKE ONE TABLET BY MOUTH AT BEDTIME 30 tablet 2  . naproxen (NAPROSYN) 250 MG tablet Take 500 mg by mouth 2 (two) times daily as needed (pain/headache).     . pantoprazole (PROTONIX) 40 MG tablet Take 40 mg by mouth at bedtime.     . rosuvastatin (CRESTOR) 5 MG tablet Take 5 mg by mouth at bedtime.     . traZODone (DESYREL) 100 MG tablet TAKE ONE TABLET BY MOUTH AT BEDTIME. 30 tablet 4  . verapamil (CALAN-SR) 240 MG CR tablet TAKE ONE TABLET BY MOUTH DAILY. 90 tablet 3  . DULoxetine (CYMBALTA) 30 MG capsule TAKE ONE CAPSULE BY MOUTH DAILY. 90 capsule 1  . gabapentin (NEURONTIN) 300 MG capsule TAKE ONE CAPSULE BY MOUTH DAILY AS NEEDED. (Patient taking differently: twice a day) 90 capsule 1   No facility-administered medications prior to visit.     PAST MEDICAL HISTORY: Past Medical History:  Diagnosis Date  . Chronic insomnia 08/30/2017  . Essential  hypertension, benign   . Fibromyalgia   . GERD (gastroesophageal reflux disease)   . Mixed hyperlipidemia   . POTS (postural orthostatic tachycardia syndrome)   . Premature ventricular contractions   . Syncope    2010    PAST SURGICAL HISTORY: Past Surgical History:  Procedure Laterality Date  . APPENDECTOMY  ~ 2000  . CHOLECYSTECTOMY  ~ 2000  . LEFT HEART CATHETERIZATION WITH CORONARY ANGIOGRAM N/A 01/07/2013   Procedure: LEFT HEART CATHETERIZATION WITH CORONARY ANGIOGRAM;  Surgeon: Minus Breeding, MD;  Location: [facility] CATH LAB;  Service: Cardiovascular;  Laterality: N/A;  . PVC ablation  04/14/13   parahisian PVC focus  ablated by Dr Rayann Heman  . SUPRAVENTRICULAR TACHYCARDIA ABLATION N/A 04/14/2013   Procedure: VT Ablation;  Surgeon: Thompson Grayer, MD;  Location[facility] LAB;  Service: Cardiovascular;  Laterality: N/A;    FAMILY HISTORY: Family History  Problem Relation Age of Onset  . CAD Mother   . Rheum arthritis Mother   . Colon cancer Father   . Atrial fibrillation Father   . Breast cancer Sister   . Lung cancer Sister   . Heart disease Maternal Grandfather   . Scleroderma Brother     SOCIAL HISTORY: Social History   Socioeconomic History  . Marital status: Married    Spouse name: Not on file  . Number of children: 2  . Years of education: Some college  . Highest education level: Not on file  Occupational History  . Occupation: N/A  Social Needs  . Financial resource strain: Not on file  . Food insecurity:    Worry: Not on file    Inability: Not on file  . Transportation needs:    Medical: Not on file    Non-medical: Not on file  Tobacco Use  . Smoking status: Never Smoker  . Smokeless tobacco: Never Used  Substance and Sexual Activity  . Alcohol use: No  . Drug use: No  . Sexual activity: Yes    Partners: Female    Comment: Married  Lifestyle  . Physical activity:    Days per week: Not on file    Minutes per session: Not on file  . Stress: Not on file  Relationships  . Social connections:    Talks on phone: Not on file    Gets together: Not on file    Attends religious service: Not on file    Active member of club or organization: Not on file    Attends meetings of clubs or organizations: Not on file    Relationship status: Not on file  . Intimate partner violence:    Fear of current or ex partner: Not on file    Emotionally abused: Not on file    Physically abused: Not on file    Forced sexual activity: Not on file  Other Topics Concern  . Not on file  Social History Narrative   Lives at home w/ his wife   Right-handed   Caffeine: 4 drinks per day       PHYSICAL EXAM  Vitals:   02/27/18 0708  BP: 118/77  Pulse: 83  Weight: 184 lb (83.5 kg)  Height: 5\' 8"  (1.727 m)   Body mass index is 27.98 kg/m.  Generalized: Well developed, in no acute distress   Neurological examination  Mentation: Alert oriented to time, place, history taking. Follows all commands speech and language fluent Cranial nerve II-XII: Pupils were equal round reactive to light. Extraocular movements were full, visual field were  full on confrontational test. Facial sensation and strength were normal. Uvula tongue midline. Head turning and shoulder shrug  were normal and symmetric. Motor: The motor testing reveals 5 over 5 strength of all 4 extremities. Good symmetric motor tone is noted throughout.  Sensory: Sensory testing is intact to soft touch on all 4 extremities. No evidence of extinction is noted.  Coordination: Cerebellar testing reveals good finger-nose-finger and heel-to-shin bilaterally.  Gait and station: Gait is cautious.  Tandem gait is normal.  Romberg is negative Reflexes: Deep tendon reflexes are symmetric and normal bilaterally.   DIAGNOSTIC DATA (LABS, IMAGING, TESTING) - I reviewed patient records, labs, notes, testing and imaging myself where available.  Lab Results  Component Value Date   WBC 6.9 04/08/2013   HGB 15.5 04/08/2013   HCT 46.2 04/08/2013   MCV 85.7 04/08/2013   PLT 206.0 04/08/2013      Component Value Date/Time   NA 137 07/03/2013 1040   K 4.8 07/03/2013 1040   CL 103 07/03/2013 1040   CO2 26 07/03/2013 1040   GLUCOSE 93 07/03/2013 1040   BUN 15 07/03/2013 1040   CREATININE 1.2 07/03/2013 1040   CALCIUM 10.0 07/03/2013 1040      ASSESSMENT AND PLAN 57 y.o. year old male  has a past medical history of Chronic insomnia (08/30/2017), Essential hypertension, benign, Fibromyalgia, GERD (gastroesophageal reflux disease), Mixed hyperlipidemia, POTS (postural orthostatic tachycardia syndrome), Premature ventricular  contractions, and Syncope. here with :  1.  Fibromyalgia  The patient will continue on gabapentin 800 mg twice a day.  He can take 300-600 mg at lunch if needed.  We will increase Cymbalta to 30 mg twice a day.  He will remain on trazodone at bedtime.  I did caution the patient that taking trazodone in addition to Cymbalta can lead to a rare but serious adverse reaction called serotonin syndrome.  I have reviewed the signs and symptoms of serotonin syndrome with the patient.  He voiced understanding.  He will follow-up in 6 months or sooner if needed.   I spent 15 minutes with the patient. 50% of this time was spent adjusting his medication and reviewing serotonin syndrome.  Ward Givens, MSN, NP-C 02/27/2018, 7:52 AM [facility] [address] [place], [place] [address] [phone]

## 2018-03-12 ENCOUNTER — Encounter (HOSPITAL_COMMUNITY)
Admission: RE | Disposition: A | Discharge status: Home / Self Care | Payer: Self-pay | Source: Ambulatory Visit | Attending: Internal Medicine

## 2018-03-12 ENCOUNTER — Ambulatory Visit (HOSPITAL_COMMUNITY)
Admission: RE | Admit: 2018-03-12 | Discharge: 2018-03-12 | Disposition: A | Discharge status: Home / Self Care | Payer: Medicare HMO | Source: Ambulatory Visit | Attending: Internal Medicine | Admitting: Internal Medicine

## 2018-03-12 ENCOUNTER — Other Ambulatory Visit: Payer: Self-pay

## 2018-03-12 ENCOUNTER — Ambulatory Visit (HOSPITAL_COMMUNITY): Admit: 2018-03-12 | Payer: BLUE CROSS/BLUE SHIELD | Admitting: Internal Medicine

## 2018-03-12 ENCOUNTER — Encounter (HOSPITAL_COMMUNITY): Payer: Self-pay | Admitting: *Deleted

## 2018-03-12 ENCOUNTER — Encounter (HOSPITAL_COMMUNITY): Payer: Self-pay

## 2018-03-12 DIAGNOSIS — Z7982 Long term (current) use of aspirin: Secondary | ICD-10-CM | POA: Diagnosis not present

## 2018-03-12 DIAGNOSIS — E782 Mixed hyperlipidemia: Secondary | ICD-10-CM | POA: Diagnosis not present

## 2018-03-12 DIAGNOSIS — R1013 Epigastric pain: Secondary | ICD-10-CM | POA: Insufficient documentation

## 2018-03-12 DIAGNOSIS — Z800 Family history of malignant neoplasm of digestive organs: Secondary | ICD-10-CM | POA: Diagnosis not present

## 2018-03-12 DIAGNOSIS — Z86010 Personal history of colonic polyps: Secondary | ICD-10-CM | POA: Diagnosis not present

## 2018-03-12 DIAGNOSIS — I10 Essential (primary) hypertension: Secondary | ICD-10-CM | POA: Diagnosis not present

## 2018-03-12 DIAGNOSIS — K219 Gastro-esophageal reflux disease without esophagitis: Secondary | ICD-10-CM | POA: Diagnosis not present

## 2018-03-12 DIAGNOSIS — I498 Other specified cardiac arrhythmias: Secondary | ICD-10-CM | POA: Insufficient documentation

## 2018-03-12 DIAGNOSIS — Z791 Long term (current) use of non-steroidal anti-inflammatories (NSAID): Secondary | ICD-10-CM | POA: Insufficient documentation

## 2018-03-12 DIAGNOSIS — K2210 Ulcer of esophagus without bleeding: Secondary | ICD-10-CM | POA: Insufficient documentation

## 2018-03-12 DIAGNOSIS — K3189 Other diseases of stomach and duodenum: Secondary | ICD-10-CM | POA: Diagnosis not present

## 2018-03-12 DIAGNOSIS — D123 Benign neoplasm of transverse colon: Secondary | ICD-10-CM | POA: Insufficient documentation

## 2018-03-12 DIAGNOSIS — M797 Fibromyalgia: Secondary | ICD-10-CM | POA: Diagnosis not present

## 2018-03-12 DIAGNOSIS — R110 Nausea: Secondary | ICD-10-CM | POA: Diagnosis not present

## 2018-03-12 DIAGNOSIS — K228 Other specified diseases of esophagus: Secondary | ICD-10-CM | POA: Diagnosis not present

## 2018-03-12 DIAGNOSIS — R197 Diarrhea, unspecified: Secondary | ICD-10-CM | POA: Diagnosis not present

## 2018-03-12 DIAGNOSIS — Z79899 Other long term (current) drug therapy: Secondary | ICD-10-CM | POA: Insufficient documentation

## 2018-03-12 DIAGNOSIS — Z6827 Body mass index (BMI) 27.0-27.9, adult: Secondary | ICD-10-CM | POA: Diagnosis not present

## 2018-03-12 DIAGNOSIS — R634 Abnormal weight loss: Secondary | ICD-10-CM | POA: Insufficient documentation

## 2018-03-12 DIAGNOSIS — K5730 Diverticulosis of large intestine without perforation or abscess without bleeding: Secondary | ICD-10-CM

## 2018-03-12 DIAGNOSIS — K648 Other hemorrhoids: Secondary | ICD-10-CM | POA: Diagnosis not present

## 2018-03-12 DIAGNOSIS — K319 Disease of stomach and duodenum, unspecified: Secondary | ICD-10-CM | POA: Insufficient documentation

## 2018-03-12 DIAGNOSIS — K317 Polyp of stomach and duodenum: Secondary | ICD-10-CM | POA: Insufficient documentation

## 2018-03-12 DIAGNOSIS — Z09 Encounter for follow-up examination after completed treatment for conditions other than malignant neoplasm: Secondary | ICD-10-CM | POA: Diagnosis not present

## 2018-03-12 DIAGNOSIS — K2970 Gastritis, unspecified, without bleeding: Secondary | ICD-10-CM

## 2018-03-12 HISTORY — PX: BIOPSY: SHX5522

## 2018-03-12 HISTORY — PX: COLONOSCOPY: SHX5424

## 2018-03-12 HISTORY — PX: ESOPHAGOGASTRODUODENOSCOPY: SHX5428

## 2018-03-12 HISTORY — PX: POLYPECTOMY: SHX5525

## 2018-03-12 SURGERY — COLONOSCOPY
Anesthesia: Moderate Sedation

## 2018-03-12 SURGERY — EGD (ESOPHAGOGASTRODUODENOSCOPY)
Anesthesia: Moderate Sedation

## 2018-03-12 MED ORDER — MEPERIDINE HCL 50 MG/ML IJ SOLN
INTRAMUSCULAR | Status: DC | PRN
  Administered 2018-03-12 (×2): 25 mg via INTRAVENOUS

## 2018-03-12 MED ORDER — PANTOPRAZOLE SODIUM 40 MG PO TBEC: 40.0000 mg | DELAYED_RELEASE_TABLET | Freq: Two times a day (BID) | ORAL | 3 refills | Status: DC

## 2018-03-12 MED ORDER — MIDAZOLAM HCL 5 MG/5ML IJ SOLN
INTRAMUSCULAR | Status: DC | PRN
  Administered 2018-03-12 (×3): 2 mg via INTRAVENOUS
  Administered 2018-03-12: 3 mg via INTRAVENOUS

## 2018-03-12 MED ORDER — MIDAZOLAM HCL 5 MG/5ML IJ SOLN
INTRAMUSCULAR | Status: AC
  Filled 2018-03-12: qty 10

## 2018-03-12 MED ORDER — MEPERIDINE HCL 50 MG/ML IJ SOLN
INTRAMUSCULAR | Status: AC
  Filled 2018-03-12: qty 1

## 2018-03-12 MED ORDER — SODIUM CHLORIDE 0.9 % IV SOLN
INTRAVENOUS | Status: DC
  Administered 2018-03-12: 07:00:00 via INTRAVENOUS

## 2018-03-12 MED ORDER — LIDOCAINE VISCOUS HCL 2 % MT SOLN
OROMUCOSAL | Status: DC | PRN
  Administered 2018-03-12: 4 mL via OROMUCOSAL

## 2018-03-12 MED ORDER — LIDOCAINE VISCOUS HCL 2 % MT SOLN
OROMUCOSAL | Status: AC
  Filled 2018-03-12: qty 15

## 2018-03-12 MED ORDER — STERILE WATER FOR IRRIGATION IR SOLN
Status: DC | PRN
  Administered 2018-03-12: 08:00:00

## 2018-03-12 NOTE — Discharge Instructions (Signed)
No aspirin or NSAIDs for 1 week. Increase pantoprazole to 40 mg twice daily.  Take it 30 minutes before breakfast and evening meal daily. Resume other medications as before. Resume usual diet. No driving for 24 hours. Physician will call with biopsy results and further recommendations.   Colon Polyps Polyps are tissue growths inside the body. Polyps can grow in many places, including the large intestine (colon). A polyp may be a round bump or a mushroom-shaped growth. You could have one polyp or several. Most colon polyps are noncancerous (benign). However, some colon polyps can become cancerous over time. What are the causes? The exact cause of colon polyps is not known. What increases the risk? This condition is more likely to develop in people who:  Have a family history of colon cancer or colon polyps.  Are older than 56 or older than 45 if they are African American.  Have inflammatory bowel disease, such as ulcerative colitis or Crohn disease.  Are overweight.  Smoke cigarettes.  Do not get enough exercise.  Drink too much alcohol.  Eat a diet that is: ? High in fat and red meat. ? Low in fiber.  Had childhood cancer that was treated with abdominal radiation.  What are the signs or symptoms? Most polyps do not cause symptoms. If you have symptoms, they may include:  Blood coming from your rectum when having a bowel movement.  Blood in your stool.The stool may look dark red or black.  A change in bowel habits, such as constipation or diarrhea.  How is this diagnosed? This condition is diagnosed with a colonoscopy. This is a procedure that uses a lighted, flexible scope to look at the inside of your colon. How is this treated? Treatment for this condition involves removing any polyps that are found. Those polyps will then be tested for cancer. If cancer is found, your health care provider will talk to you about options for colon cancer treatment. Follow these  instructions at home: Diet  Eat plenty of fiber, such as fruits, vegetables, and whole grains.  Eat foods that are high in calcium and vitamin D, such as milk, cheese, yogurt, eggs, liver, fish, and broccoli.  Limit foods high in fat, red meats, and processed meats, such as hot dogs, sausage, bacon, and lunch meats.  Maintain a healthy weight, or lose weight if recommended by your health care provider. General instructions  Do not smoke cigarettes.  Do not drink alcohol excessively.  Keep all follow-up visits as told by your health care provider. This is important. This includes keeping regularly scheduled colonoscopies. Talk to your health care provider about when you need a colonoscopy.  Exercise every day or as told by your health care provider. Contact a health care provider if:  You have new or worsening bleeding during a bowel movement.  You have new or increased blood in your stool.  You have a change in bowel habits.  You unexpectedly lose weight.   Gastrointestinal Endoscopy, Care After Refer to this sheet in the next few weeks. These instructions provide you with information about caring for yourself after your procedure. Your health care provider may also give you more specific instructions. Your treatment has been planned according to current medical practices, but problems sometimes occur. Call your health care provider if you have any problems or questions after your procedure. What can I expect after the procedure? After your procedure, it is common to feel:  Bloated.  Soreness in your throat.  Sleepy.  Follow these instructions at home:  Do not drive for 24 hours if you received a if you received a medicine to help you relax (sedative).  Avoid drinking warm beverages and alcohol for the first 24 hours after the procedure.  Take over-the-counter and prescription medicines only as told by your health care provider.  Drink enough fluids to keep your urine  clear or pale yellow.  If you feel bloated, try going for a walk. Walking may help the feeling go away.  If your throat is sore, try gargling with salt water. Get help right away if:  You have severe nausea or vomiting.  You have severe abdominal pain, abdominal cramps that last longer than 6 hours, or abdominal swelling.  You have severe shoulder or back pain.  You have trouble swallowing.  You have shortness of breath, your breathing is shallow, or you breathing is faster than normal.  You have a fever.  Your heart is beating very fast.  You vomit blood or material that looks like coffee grounds.  You have bloody, black, or tarry stools.   Gastroesophageal Reflux Disease, Adult Normally, food travels down the esophagus and stays in the stomach to be digested. However, when a person has gastroesophageal reflux disease (GERD), food and stomach acid move back up into the esophagus. When this happens, the esophagus becomes sore and inflamed. Over time, GERD can create small holes (ulcers) in the lining of the esophagus. What are the causes? This condition is caused by a problem with the muscle between the esophagus and the stomach (lower esophageal sphincter, or LES). Normally, the LES muscle closes after food passes through the esophagus to the stomach. When the LES is weakened or abnormal, it does not close properly, and that allows food and stomach acid to go back up into the esophagus. The LES can be weakened by certain dietary substances, medicines, and medical conditions, including:  Tobacco use.  Pregnancy.  Having a hiatal hernia.  Heavy alcohol use.  Certain foods and beverages, such as coffee, chocolate, onions, and peppermint.  What increases the risk? This condition is more likely to develop in:  People who have an increased body weight.  People who have connective tissue disorders.  People who use NSAID medicines.  What are the signs or symptoms? Symptoms  of this condition include:  Heartburn.  Difficult or painful swallowing.  The feeling of having a lump in the throat.  Abitter taste in the mouth.  Bad breath.  Having a large amount of saliva.  Having an upset or bloated stomach.  Belching.  Chest pain.  Shortness of breath or wheezing.  Ongoing (chronic) cough or a night-time cough.  Wearing away of tooth enamel.  Weight loss.  Different conditions can cause chest pain. Make sure to see your health care provider if you experience chest pain. How is this diagnosed? Your health care provider will take a medical history and perform a physical exam. To determine if you have mild or severe GERD, your health care provider may also monitor how you respond to treatment. You may also have other tests, including:  An endoscopy toexamine your stomach and esophagus with a small camera.  A test thatmeasures the acidity level in your esophagus.  A test thatmeasures how much pressure is on your esophagus.  A barium swallow or modified barium swallow to show the shape, size, and functioning of your esophagus.  How is this treated? The goal of treatment is to help relieve your symptoms  and to prevent complications. Treatment for this condition may vary depending on how severe your symptoms are. Your health care provider may recommend:  Changes to your diet.  Medicine.  Surgery.  Follow these instructions at home: Diet  Follow a diet as recommended by your health care provider. This may involve avoiding foods and drinks such as: ? Coffee and tea (with or without caffeine). ? Drinks that containalcohol. ? Energy drinks and sports drinks. ? Carbonated drinks or sodas. ? Chocolate and cocoa. ? Peppermint and mint flavorings. ? Garlic and onions. ? Horseradish. ? Spicy and acidic foods, including peppers, chili powder, curry powder, vinegar, hot sauces, and barbecue sauce. ? Citrus fruit juices and citrus fruits, such  as oranges, lemons, and limes. ? Tomato-based foods, such as red sauce, chili, salsa, and pizza with red sauce. ? Fried and fatty foods, such as donuts, french fries, potato chips, and high-fat dressings. ? High-fat meats, such as hot dogs and fatty cuts of red and white meats, such as rib eye steak, sausage, ham, and bacon. ? High-fat dairy items, such as whole milk, butter, and cream cheese.  Eat small, frequent meals instead of large meals.  Avoid drinking large amounts of liquid with your meals.  Avoid eating meals during the 2-3 hours before bedtime.  Avoid lying down right after you eat.  Do not exercise right after you eat. General instructions  Pay attention to any changes in your symptoms.  Take over-the-counter and prescription medicines only as told by your health care provider. Do not take aspirin, ibuprofen, or other NSAIDs unless your health care provider told you to do so.  Do not use any tobacco products, including cigarettes, chewing tobacco, and e-cigarettes. If you need help quitting, ask your health care provider.  Wear loose-fitting clothing. Do not wear anything tight around your waist that causes pressure on your abdomen.  Raise (elevate) the head of your bed 6 inches (15cm).  Try to reduce your stress, such as with yoga or meditation. If you need help reducing stress, ask your health care provider.  If you are overweight, reduce your weight to an amount that is healthy for you. Ask your health care provider for guidance about a safe weight loss goal.  Keep all follow-up visits as told by your health care provider. This is important. Contact a health care provider if:  You have new symptoms.  You have unexplained weight loss.  You have difficulty swallowing, or it hurts to swallow.  You have wheezing or a persistent cough.  Your symptoms do not improve with treatment.  You have a hoarse voice. Get help right away if:  You have pain in your arms,  neck, jaw, teeth, or back.  You feel sweaty, dizzy, or light-headed.  You have chest pain or shortness of breath.  You vomit and your vomit looks like blood or coffee grounds.  You faint.  Your stool is bloody or black.  You cannot swallow, drink, or eat.   Colonoscopy, Adult, Care After This sheet gives you information about how to care for yourself after your procedure. Your health care provider may also give you more specific instructions. If you have problems or questions, contact your health care provider. What can I expect after the procedure? After the procedure, it is common to have:  A small amount of blood in your stool for 24 hours after the procedure.  Some gas.  Mild abdominal cramping or bloating.  Follow these instructions at home: General  instructions   For the first 24 hours after the procedure: ? Do not drive or use machinery. ? Do not sign important documents. ? Do not drink alcohol. ? Do your regular daily activities at a slower pace than normal. ? Eat soft, easy-to-digest foods. ? Rest often.  Take over-the-counter or prescription medicines only as told by your health care provider.  It is up to you to get the results of your procedure. Ask your health care provider, or the department performing the procedure, when your results will be ready. Relieving cramping and bloating  Try walking around when you have cramps or feel bloated.  Apply heat to your abdomen as told by your health care provider. Use a heat source that your health care provider recommends, such as a moist heat pack or a heating pad. ? Place a towel between your skin and the heat source. ? Leave the heat on for 20-30 minutes. ? Remove the heat if your skin turns bright red. This is especially important if you are unable to feel pain, heat, or cold. You may have a greater risk of getting burned. Eating and drinking  Drink enough fluid to keep your urine clear or pale  yellow.  Resume your normal diet as instructed by your health care provider. Avoid heavy or fried foods that are hard to digest.  Avoid drinking alcohol for as long as instructed by your health care provider. Contact a health care provider if:  You have blood in your stool 2-3 days after the procedure. Get help right away if:  You have more than a small spotting of blood in your stool.  You pass large blood clots in your stool.  Your abdomen is swollen.  You have nausea or vomiting.  You have a fever.  You have increasing abdominal pain that is not relieved with medicine. This information is not intended to replace advice given to you by your health care provider. Make sure you discuss any questions you have with your health care provider. Document Released: 03/06/2004 Document Revised: 04/16/2016 Document Reviewed: 10/04/2015 Elsevier Interactive Patient Education  [employer].

## 2018-03-12 NOTE — H&P (Addendum)
Anthony Sherman is an 57 y.o. male.   Chief Complaint: Patient is here for EGD and colonoscopy. HPI: Patient is 57 year old Caucasian male was chronic GERD and not responding to therapy.  He is on pantoprazole.  He has tried OTC Nexium.  He has frequent heartburn regurgitation.  He has nausea but no vomiting.  He also has intermittent diarrhea with urgency without melena or rectal bleeding.  He states he has lost 30 pounds in the last 3 months she is trying not to.  He also complains of intermittent epigastric pain.  He is on low-dose aspirin and may take Naprosyn once in a while. He has a history of colonic polyps.   Last colonoscopy was in November 2010. Family history is positive for CRC and father who is not 104s at the time of diagnosis.  He had stopped colectomy for recurrent disease last year.   Past Medical History:  Diagnosis Date  . Chronic insomnia 08/30/2017  . Essential hypertension, benign   . Fibromyalgia   . GERD (gastroesophageal reflux disease)   . Mixed hyperlipidemia   . POTS (postural orthostatic tachycardia syndrome)   . Premature ventricular contractions   . Syncope    2010    Past Surgical History:  Procedure Laterality Date  . APPENDECTOMY  ~ 2000  . CHOLECYSTECTOMY  ~ 2000  . LEFT HEART CATHETERIZATION WITH CORONARY ANGIOGRAM N/A 01/07/2013   Procedure: LEFT HEART CATHETERIZATION WITH CORONARY ANGIOGRAM;  Surgeon: Minus Breeding, MD;  Location: [facility] CATH LAB;  Service: Cardiovascular;  Laterality: N/A;  . PVC ablation  04/14/13   parahisian PVC focus ablated by Dr Rayann Heman  . SUPRAVENTRICULAR TACHYCARDIA ABLATION N/A 04/14/2013   Procedure: VT Ablation;  Surgeon: Thompson Grayer, MD;  Location: [facility] CATH LAB;  Service: Cardiovascular;  Laterality: N/A;    Family History  Problem Relation Age of Onset  . CAD Mother   . Rheum arthritis Mother   . Colon cancer Father   . Atrial fibrillation Father   . Breast cancer Sister   . Lung cancer Sister   . Heart disease Maternal  Grandfather   . Scleroderma Brother    Social History:  reports that he has never smoked. He has never used smokeless tobacco. He reports that he does not drink alcohol or use drugs.  Allergies:  Allergies  Allergen Reactions  . Flecainide Other (See Comments)    SOB 2014  . Multaq [Dronedarone] Other (See Comments)    Nightmares    Medications Prior to Admission  Medication Sig Dispense Refill  . aspirin EC 81 MG tablet Take 81 mg by mouth daily.    . DULoxetine (CYMBALTA) 30 MG capsule Take 1 capsule (30 mg total) by mouth 2 (two) times daily. 60 capsule 5  . gabapentin (NEURONTIN) 300 MG capsule Take 1-2 capsules (300-600 mg total) by mouth daily as needed. (Patient taking differently: Take 300-600 mg by mouth daily as needed (for pain). ) 90 capsule 1  . gabapentin (NEURONTIN) 800 MG tablet TAKE ONE TABLET BY MOUTH TWICE DAILY 180 tablet 1  . lisinopril (PRINIVIL,ZESTRIL) 20 MG tablet TAKE ONE TABLET BY MOUTH AT BEDTIME 30 tablet 2  . naproxen (NAPROSYN) 500 MG tablet Take 500 mg by mouth 2 (two) times daily as needed for moderate pain.     . pantoprazole (PROTONIX) 40 MG tablet Take 40 mg by mouth at bedtime.     . rosuvastatin (CRESTOR) 5 MG tablet Take 5 mg by mouth at bedtime.     Marland Kitchen  traZODone (DESYREL) 100 MG tablet TAKE ONE TABLET BY MOUTH AT BEDTIME. 30 tablet 4  . verapamil (CALAN-SR) 240 MG CR tablet TAKE ONE TABLET BY MOUTH DAILY. 90 tablet 3    No results found for this or any previous visit (from the past 48 hour(s)). No results found.  ROS  Blood pressure (!) 149/92, pulse 84, temperature 97.9 F (36.6 C), temperature source Oral, resp. rate (!) 27, height 5\' 8"  (1.727 m), weight 184 lb (83.5 kg), SpO2 100 %. Physical Exam  Constitutional: He appears well-developed and well-nourished.  HENT:  Mouth/Throat: Oropharynx is clear and moist.  Eyes: Conjunctivae are normal. No scleral icterus.  Neck: No thyromegaly present.  Cardiovascular: Normal rate, regular  rhythm and normal heart sounds.  No murmur heard. Respiratory: Effort normal and breath sounds normal.  GI:  Abdomen is symmetrical and soft with mild tenderness in epigastrium and left lower quadrant.  No organomegaly or masses.  Lymphadenopathy:    He has no cervical adenopathy.  Neurological: He is alert.  Skin: Skin is warm and dry.     Assessment/Plan GERD refractory to therapy. Nausea and weight loss. History of colonic polyps and family history of CRC in first-degree relative. Diagnostic EGD and surveillance colonoscopy.  Hildred Laser, MD 03/12/2018, 7:38 AM

## 2018-03-12 NOTE — Op Note (Signed)
[facility] Patient Name: Anthony Sherman Procedure Date: 03/12/2018 7:28 AM MRN: [id number] Date of Birth: 04-15-1961 Attending MD: Hildred Laser , MD CSN: [id number] Age: 57 Admit Type: Outpatient Procedure:                Upper GI endoscopy Indications:              Follow-up of gastro-esophageal reflux disease Providers:                Hildred Laser, MD, Otis Peak B. Sharon Seller, RN, Nelma Rothman, Technician Referring MD:             Rory Percy, MD Medicines:                Lidocaine spray, Meperidine 50 mg IV, Midazolam 7                            mg IV Complications:            No immediate complications. Estimated Blood Loss:     Estimated blood loss was minimal. Procedure:                Pre-Anesthesia Assessment:                           - Prior to the procedure, a History and Physical                            was performed, and patient medications and                            allergies were reviewed. The patient's tolerance of                            previous anesthesia was also reviewed. The risks                            and benefits of the procedure and the sedation                            options and risks were discussed with the patient.                            All questions were answered, and informed consent                            was obtained. Prior Anticoagulants: The patient                            last took aspirin 4 days prior to the procedure.                            ASA Grade Assessment: III - A patient with severe  systemic disease. After reviewing the risks and                            benefits, the patient was deemed in satisfactory                            condition to undergo the procedure.                           After obtaining informed consent, the endoscope was                            passed under direct vision. Throughout the                            procedure, the  patient's blood pressure, pulse, and                            oxygen saturations were monitored continuously. The                            GIF-H190 ([id number]) was introduced through the                            mouth, and advanced to the second part of duodenum.                            The upper GI endoscopy was accomplished without                            difficulty. The patient tolerated the procedure                            well. Scope In: 7:50:00 AM Scope Out: 8:09:34 AM Total Procedure Duration: 0 hours 19 minutes 34 seconds  Findings:      Two erosions were found in the mid esophagus. Biopsies were taken with a       cold forceps for histology. The pathology specimen was placed into       Bottle Number 4.      Diffuse mild mucosal changes characterized by granularity and       longitudinal markings were found in the mid esophagus and in the distal       esophagus. Biopsies were taken with a cold forceps for histology. The       pathology specimen was placed into Bottle Number 3.      The Z-line was irregular and was found 38 cm from the incisors.      Patchy inflammation characterized by congestion (edema) and erosions was       found in the gastric antrum. Biopsies were taken with a cold forceps for       histology. The pathology specimen was placed into Bottle Number 1.      Multiple 3 to 7 mm sessile polyps were found in the gastric fundus and       in the gastric body. Biopsies were taken with a cold forceps for  histology. The pathology specimen was placed into Bottle Number 2.      The duodenal bulb and second portion of the duodenum were normal. Impression:               - Two erosions in the mid esophagus. Biopsied.                           - Granular, longitudinally marked mucosa in the                            esophagus. Biopsied.                           - Z-line irregular, 38 cm from the incisors.                           - Gastritis.  Biopsied.                           - Multiple gastric polyps. Biopsied.                           - Normal duodenal bulb and second portion of the                            duodenum. Moderate Sedation:      Moderate (conscious) sedation was administered by the endoscopy nurse       and supervised by the endoscopist. The following parameters were       monitored: oxygen saturation, heart rate, blood pressure, CO2       capnography and response to care. Total physician intraservice time was       23 minutes. Recommendation:           - Patient has a contact number available for                            emergencies. The signs and symptoms of potential                            delayed complications were discussed with the                            patient. Return to normal activities tomorrow.                            Written discharge instructions were provided to the                            patient.                           - Resume previous diet today.                           - Continue present medications.                           -  Use Protonix (pantoprazole) 40 mg PO BID.                           - Await pathology results.                           - See the other procedure note for documentation of                            additional recommendations. Procedure Code(s):        --- Professional ---                           [phone], Esophagogastroduodenoscopy, flexible,                            transoral; with biopsy, single or multiple                           G0500, Moderate sedation services provided by the                            same physician or other qualified health care                            professional performing a gastrointestinal                            endoscopic service that sedation supports,                            requiring the presence of an independent trained                            observer to assist in the monitoring of the                             patient's level of consciousness and physiological                            status; initial 15 minutes of intra-service time;                            patient age 75 years or older (additional time may                            be reported with [phone], as appropriate)                           [phone], Moderate sedation services provided by the                            same physician or other qualified health care  professional performing the diagnostic or                            therapeutic service that the sedation supports,                            requiring the presence of an independent trained                            observer to assist in the monitoring of the                            patient's level of consciousness and physiological                            status; each additional 15 minutes intraservice                            time (List separately in addition to code for                            primary service) Diagnosis Code(s):        --- Professional ---                           K22.10, Ulcer of esophagus without bleeding                           K22.8, Other specified diseases of esophagus                           K29.70, Gastritis, unspecified, without bleeding                           K31.7, Polyp of stomach and duodenum                           K21.9, Gastro-esophageal reflux disease without                            esophagitis CPT copyright 2017 [employer]. All rights reserved. The codes documented in this report are preliminary and upon coder review may  be revised to meet current compliance requirements. Hildred Laser, MD Hildred Laser, MD 03/12/2018 9:07:52 AM This report has been signed electronically. Number of Addenda: 0

## 2018-03-12 NOTE — Op Note (Signed)
[facility] Patient Name: Anthony Sherman Procedure Date: 03/12/2018 8:14 AM MRN: [id number] Date of Birth: 1961-06-27 Attending MD: Hildred Laser , MD CSN: [id number] Age: 57 Admit Type: Outpatient Procedure:                Colonoscopy Indications:              High risk colon cancer surveillance: Personal                            history of colonic polyps, Family history of colon                            cancer in a first-degree relative Providers:                Hildred Laser, MD, Gwenlyn Fudge RN, RN, Nelma Rothman,                            Technician Referring MD:             Rory Percy, MD Medicines:                Midazolam 2 mg IV Complications:            No immediate complications. Estimated Blood Loss:     Estimated blood loss was minimal. Procedure:                Pre-Anesthesia Assessment:                           - Prior to the procedure, a History and Physical                            was performed, and patient medications and                            allergies were reviewed. The patient's tolerance of                            previous anesthesia was also reviewed. The risks                            and benefits of the procedure and the sedation                            options and risks were discussed with the patient.                            All questions were answered, and informed consent                            was obtained. Prior Anticoagulants: The patient                            last took aspirin 4 days prior to the procedure.  ASA Grade Assessment: III - A patient with severe                            systemic disease. After reviewing the risks and                            benefits, the patient was deemed in satisfactory                            condition to undergo the procedure.                           After obtaining informed consent, the colonoscope                            was passed under  direct vision. Throughout the                            procedure, the patient's blood pressure, pulse, and                            oxygen saturations were monitored continuously. The                            PCF-H190DL ([id number]) was introduced through the                            anus and advanced to the the cecum, identified by                            appendiceal orifice and ileocecal valve. The                            colonoscopy was performed without difficulty. The                            patient tolerated the procedure well. The quality                            of the bowel preparation was good. The ileocecal                            valve, appendiceal orifice, and rectum were                            photographed. Scope In: 8:15:51 AM Scope Out: 8:51:42 AM Scope Withdrawal Time: 0 hours 19 minutes 25 seconds  Total Procedure Duration: 0 hours 35 minutes 51 seconds  Findings:      The perianal and digital rectal examinations were normal.      A 6 mm polyp was found in the hepatic flexure. The polyp was sessile.       The polyp was removed with a hot snare. Resection and retrieval were       complete. The pathology specimen was placed into  Bottle Number 5.      Two sessile polyps were found in the transverse colon. The polyps were       small in size. These polyps were removed with a cold snare. Resection       and retrieval were complete. The pathology specimen was placed into       Bottle Number 5.      A 6 mm polyp was found in the distal transverse colon. The polyp was       sessile. The polyp was removed with a hot snare. Resection and retrieval       were complete. The pathology specimen was placed into Bottle Number 5.      A 6 mm polyp was found in the splenic flexure. The polyp was sessile.       The polyp was removed with a hot snare. Resection and retrieval were       complete. The pathology specimen was placed into Bottle Number 6.      A few  small-mouthed diverticula were found in the sigmoid colon and       descending colon.      External and internal hemorrhoids were found during retroflexion. The       hemorrhoids were small. Impression:               - One 6 mm polyp at the hepatic flexure, removed                            with a hot snare. Resected and retrieved.                           - Two small polyps in the transverse colon, removed                            with a cold snare. Resected and retrieved.                           - One 6 mm polyp in the distal transverse colon,                            removed with a hot snare. Resected and retrieved.                           - One 6 mm polyp at the splenic flexure, removed                            with a hot snare. Resected and retrieved.                           - Diverticulosis in the sigmoid colon and in the                            descending colon.                           - External and internal hemorrhoids. Moderate Sedation:      Moderate (conscious) sedation was administered by the endoscopy nurse  and supervised by the endoscopist. The following parameters were       monitored: oxygen saturation, heart rate, blood pressure, CO2       capnography and response to care. Total physician intraservice time was       42 minutes. Recommendation:           - Patient has a contact number available for                            emergencies. The signs and symptoms of potential                            delayed complications were discussed with the                            patient. Return to normal activities tomorrow.                            Written discharge instructions were provided to the                            patient.                           - High fiber diet today.                           - No aspirin, ibuprofen, naproxen, or other                            non-steroidal anti-inflammatory drugs for 7 days.                            - Await pathology results.                           - Repeat colonoscopy in 5 years for surveillance. Procedure Code(s):        --- Professional ---                           [phone], Colonoscopy, flexible; with removal of                            tumor(s), polyp(s), or other lesion(s) by snare                            technique                           G0500, Moderate sedation services provided by the                            same physician or other qualified health care                            professional performing a gastrointestinal  endoscopic service that sedation supports,                            requiring the presence of an independent trained                            observer to assist in the monitoring of the                            patient's level of consciousness and physiological                            status; initial 15 minutes of intra-service time;                            patient age 68 years or older (additional time may                            be reported with [phone], as appropriate)                           [phone], Moderate sedation services provided by the                            same physician or other qualified health care                            professional performing the diagnostic or                            therapeutic service that the sedation supports,                            requiring the presence of an independent trained                            observer to assist in the monitoring of the                            patient's level of consciousness and physiological                            status; each additional 15 minutes intraservice                            time (List separately in addition to code for                            primary service)                           [phone], Moderate sedation services provided by the  same physician or other qualified  health care                            professional performing the diagnostic or                            therapeutic service that the sedation supports,                            requiring the presence of an independent trained                            observer to assist in the monitoring of the                            patient's level of consciousness and physiological                            status; each additional 15 minutes intraservice                            time (List separately in addition to code for                            primary service) Diagnosis Code(s):        --- Professional ---                           D12.3, Benign neoplasm of transverse colon (hepatic                            flexure or splenic flexure)                           Z86.010, Personal history of colonic polyps                           K64.8, Other hemorrhoids                           Z80.0, Family history of malignant neoplasm of                            digestive organs                           K57.30, Diverticulosis of large intestine without                            perforation or abscess without bleeding CPT copyright 2017 American Medical Association. All rights reserved. The codes documented in this report are preliminary and upon coder review may  be revised to meet current compliance requirements. Hildred Laser, MD Hildred Laser, MD 03/12/2018 9:13:11 AM This report has been signed electronically. Number of Addenda: 0

## 2018-03-17 ENCOUNTER — Encounter (HOSPITAL_COMMUNITY): Payer: Self-pay | Admitting: Internal Medicine

## 2018-03-20 ENCOUNTER — Other Ambulatory Visit (INDEPENDENT_AMBULATORY_CARE_PROVIDER_SITE_OTHER): Payer: Self-pay | Admitting: *Deleted

## 2018-03-20 DIAGNOSIS — R1013 Epigastric pain: Secondary | ICD-10-CM

## 2018-03-20 DIAGNOSIS — R634 Abnormal weight loss: Secondary | ICD-10-CM

## 2018-03-20 NOTE — Progress Notes (Signed)
CT

## 2018-03-26 ENCOUNTER — Ambulatory Visit (HOSPITAL_COMMUNITY)
Admission: RE | Admit: 2018-03-26 | Discharge: 2018-03-26 | Disposition: A | Discharge status: Home / Self Care | Payer: Medicare HMO | Source: Ambulatory Visit | Attending: Internal Medicine | Admitting: Internal Medicine

## 2018-03-26 DIAGNOSIS — R935 Abnormal findings on diagnostic imaging of other abdominal regions, including retroperitoneum: Secondary | ICD-10-CM | POA: Diagnosis not present

## 2018-03-26 DIAGNOSIS — R1013 Epigastric pain: Secondary | ICD-10-CM | POA: Diagnosis not present

## 2018-03-26 DIAGNOSIS — R634 Abnormal weight loss: Secondary | ICD-10-CM | POA: Diagnosis not present

## 2018-03-26 LAB — POCT I-STAT CREATININE: Creatinine, Ser: 1.1 mg/dL (ref 0.61–1.24)

## 2018-03-26 MED ORDER — IOPAMIDOL (ISOVUE-300) INJECTION 61%
100.0000 mL | Freq: Once | INTRAVENOUS | Status: AC | PRN
  Administered 2018-03-26: 100 mL via INTRAVENOUS

## 2018-04-03 ENCOUNTER — Other Ambulatory Visit: Payer: Self-pay | Admitting: Internal Medicine

## 2018-04-08 ENCOUNTER — Other Ambulatory Visit: Payer: Self-pay | Admitting: Neurology

## 2018-04-10 ENCOUNTER — Other Ambulatory Visit (INDEPENDENT_AMBULATORY_CARE_PROVIDER_SITE_OTHER): Payer: Self-pay | Admitting: Internal Medicine

## 2018-04-10 ENCOUNTER — Other Ambulatory Visit (INDEPENDENT_AMBULATORY_CARE_PROVIDER_SITE_OTHER): Payer: Self-pay | Admitting: *Deleted

## 2018-04-10 DIAGNOSIS — R1013 Epigastric pain: Secondary | ICD-10-CM

## 2018-04-10 DIAGNOSIS — R935 Abnormal findings on diagnostic imaging of other abdominal regions, including retroperitoneum: Secondary | ICD-10-CM

## 2018-04-10 DIAGNOSIS — R634 Abnormal weight loss: Secondary | ICD-10-CM

## 2018-04-11 ENCOUNTER — Ambulatory Visit: Payer: Medicare HMO | Admitting: Internal Medicine

## 2018-04-11 ENCOUNTER — Encounter: Payer: Self-pay | Admitting: Internal Medicine

## 2018-04-11 VITALS — BP 120/80 | HR 73 | Ht 68.0 in | Wt 184.0 lb

## 2018-04-11 DIAGNOSIS — R29818 Other symptoms and signs involving the nervous system: Secondary | ICD-10-CM | POA: Diagnosis not present

## 2018-04-11 DIAGNOSIS — I493 Ventricular premature depolarization: Secondary | ICD-10-CM | POA: Diagnosis not present

## 2018-04-11 DIAGNOSIS — I10 Essential (primary) hypertension: Secondary | ICD-10-CM | POA: Diagnosis not present

## 2018-04-11 DIAGNOSIS — R5383 Other fatigue: Secondary | ICD-10-CM

## 2018-04-11 NOTE — Patient Instructions (Signed)
Medication Instructions:  Continue all current medications.  Labwork: none  Testing/Procedures: Sleep study.  Follow-Up: Your physician wants you to follow up in:  1 year.  You will receive a reminder letter in the mail one-two months in advance.  If you don't receive a letter, please call our office to schedule the follow up appointment.  Any Other Special Instructions Will Be Listed Below (If Applicable). You have been referred to:  Dr. Nehemiah Settle [facility]  If you need a refill on your cardiac medications before your next appointment, please call your pharmacy.

## 2018-04-11 NOTE — Progress Notes (Signed)
PCP: Rory Percy, MD Primary Cardiologist: Dr Domenic Polite Primary EP: Dr Rayann Heman  Anthony Sherman is a 57 y.o. male who presents today for routine electrophysiology followup.  Since last being seen in our clinic, the patient reports doing reasonably well.   He has fatigue.  Finds that he is sleepy during the day.  Snores and wakes frequently at night. Today, he denies symptoms of palpitations, chest pain, shortness of breath,  lower extremity edema, dizziness, presyncope, or syncope.  The patient is otherwise without complaint today.   Past Medical History:  Diagnosis Date  . Chronic insomnia 08/30/2017  . Essential hypertension, benign   . Fibromyalgia   . GERD (gastroesophageal reflux disease)   . Mixed hyperlipidemia   . POTS (postural orthostatic tachycardia syndrome)   . Premature ventricular contractions   . Syncope    2010   Past Surgical History:  Procedure Laterality Date  . APPENDECTOMY  ~ 2000  . BIOPSY  03/12/2018   Procedure: BIOPSY;  Surgeon: Rogene Houston, MD;  Location: [facility];  Service: Endoscopy;;  gastric esophageal  . CHOLECYSTECTOMY  ~ 2000  . COLONOSCOPY N/A 03/12/2018   Procedure: COLONOSCOPY;  Surgeon: Rogene Houston, MD;  Location: [facility];  Service: Endoscopy;  Laterality: N/A;  . ESOPHAGOGASTRODUODENOSCOPY N/A 03/12/2018   Procedure: ESOPHAGOGASTRODUODENOSCOPY (EGD);  Surgeon: Rogene Houston, MD;  Location: [facility];  Service: Endoscopy;  Laterality: N/A;  7:30  . LEFT HEART CATHETERIZATION WITH CORONARY ANGIOGRAM N/A 01/07/2013   Procedure: LEFT HEART CATHETERIZATION WITH CORONARY ANGIOGRAM;  Surgeon: Minus Breeding, MD;  Location: [facility] CATH LAB;  Service: Cardiovascular;  Laterality: N/A;  . POLYPECTOMY  03/12/2018   Procedure: POLYPECTOMY;  Surgeon: Rogene Houston, MD;  Location: [facility];  Service: Endoscopy;;  colon  . PVC ablation  04/14/13   parahisian PVC focus ablated by Dr Rayann Heman  . SUPRAVENTRICULAR TACHYCARDIA ABLATION N/A  04/14/2013   Procedure: VT Ablation;  Surgeon: Thompson Grayer, MD;  Location: [facility] CATH LAB;  Service: Cardiovascular;  Laterality: N/A;    ROS- all systems are reviewed and negatives except as per HPI above  Current Outpatient Medications  Medication Sig Dispense Refill  . aspirin EC 81 MG tablet Take 1 tablet (81 mg total) by mouth daily.    . DULoxetine (CYMBALTA) 30 MG capsule Take 1 capsule (30 mg total) by mouth 2 (two) times daily. 60 capsule 5  . gabapentin (NEURONTIN) 300 MG capsule Take 1-2 capsules (300-600 mg total) by mouth daily as needed. (Patient taking differently: Take 300-600 mg by mouth daily as needed (for pain). ) 90 capsule 1  . gabapentin (NEURONTIN) 800 MG tablet TAKE ONE TABLET BY MOUTH TWICE DAILY 180 tablet 1  . lisinopril (PRINIVIL,ZESTRIL) 20 MG tablet TAKE ONE TABLET BY MOUTH AT BEDTIME 30 tablet 2  . pantoprazole (PROTONIX) 40 MG tablet Take 1 tablet (40 mg total) by mouth 2 (two) times daily before a meal. 60 tablet 3  . rosuvastatin (CRESTOR) 5 MG tablet Take 5 mg by mouth at bedtime.     . traZODone (DESYREL) 100 MG tablet TAKE ONE TABLET BY MOUTH AT BEDTIME. 30 tablet 4  . verapamil (CALAN-SR) 240 MG CR tablet TAKE ONE TABLET BY MOUTH DAILY. 90 tablet 3   No current facility-administered medications for this visit.     Physical Exam: Vitals:   04/11/18 1052  BP: 120/80  Pulse: 73  SpO2: 93%  Weight: 184 lb (83.5 kg)  Height: 5'  8" (1.727 m)    GEN- The patient is well appearing, alert and oriented x 3 today.   Head- normocephalic, atraumatic Eyes-  Sclera clear, conjunctiva pink Ears- hearing intact Oropharynx- clear Lungs- Clear to ausculation bilaterally, normal work of breathing Heart- Regular rate and rhythm, no murmurs, rubs or gallops, PMI not laterally displaced GI- soft, NT, ND, + BS Extremities- no clubbing, cyanosis, or edema  Wt Readings from Last 3 Encounters:  04/11/18 184 lb (83.5 kg)  03/12/18 184 lb (83.5 kg)  02/27/18 184 lb  (83.5 kg)    EKG tracing ordered today is personally reviewed and shows sinus with PVCs  Assessment and Plan:  1. PVCs Stable with verapamil 240mg  daily No changes  2. HTN Stable No change required today  3. Snoring, frequent waking at night, fatigue I am concerned about OSA Will refer to Dr Maxwell Caul for evaluation.  Return in a year  Thompson Grayer MD, [facility] 04/11/2018 11:20 AM

## 2018-04-15 ENCOUNTER — Ambulatory Visit (HOSPITAL_COMMUNITY)
Admission: RE | Admit: 2018-04-15 | Discharge: 2018-04-15 | Disposition: A | Discharge status: Home / Self Care | Payer: Medicare HMO | Source: Ambulatory Visit | Attending: Internal Medicine | Admitting: Internal Medicine

## 2018-04-15 DIAGNOSIS — R1110 Vomiting, unspecified: Secondary | ICD-10-CM | POA: Diagnosis not present

## 2018-04-15 DIAGNOSIS — R1013 Epigastric pain: Secondary | ICD-10-CM | POA: Diagnosis not present

## 2018-04-15 DIAGNOSIS — R935 Abnormal findings on diagnostic imaging of other abdominal regions, including retroperitoneum: Secondary | ICD-10-CM | POA: Diagnosis not present

## 2018-04-15 DIAGNOSIS — R197 Diarrhea, unspecified: Secondary | ICD-10-CM | POA: Diagnosis not present

## 2018-04-15 DIAGNOSIS — R634 Abnormal weight loss: Secondary | ICD-10-CM | POA: Diagnosis not present

## 2018-04-15 MED ORDER — IOPAMIDOL (ISOVUE-300) INJECTION 61%
125.0000 mL | Freq: Once | INTRAVENOUS | Status: AC | PRN
  Administered 2018-04-15: 125 mL via INTRAVENOUS

## 2018-04-28 ENCOUNTER — Encounter (INDEPENDENT_AMBULATORY_CARE_PROVIDER_SITE_OTHER): Payer: Self-pay | Admitting: Internal Medicine

## 2018-04-28 ENCOUNTER — Ambulatory Visit (INDEPENDENT_AMBULATORY_CARE_PROVIDER_SITE_OTHER): Payer: Medicare HMO | Admitting: Internal Medicine

## 2018-04-28 VITALS — BP 110/80 | HR 67 | Temp 98.0°F | Resp 18 | Ht 68.0 in | Wt 176.8 lb

## 2018-04-28 DIAGNOSIS — R197 Diarrhea, unspecified: Secondary | ICD-10-CM | POA: Diagnosis not present

## 2018-04-28 DIAGNOSIS — K219 Gastro-esophageal reflux disease without esophagitis: Secondary | ICD-10-CM | POA: Diagnosis not present

## 2018-04-28 DIAGNOSIS — R634 Abnormal weight loss: Secondary | ICD-10-CM

## 2018-04-28 MED ORDER — CIPROFLOXACIN HCL 500 MG PO TABS: 500.0000 mg | ORAL_TABLET | Freq: Two times a day (BID) | ORAL | 0 refills | Status: DC

## 2018-04-28 NOTE — Patient Instructions (Signed)
Keep daily symptom diary while you are on antibiotic. Call office if diarrhea worsens. Physician will call with results of blood test when completed.

## 2018-04-28 NOTE — Progress Notes (Signed)
Presenting complaint;  Follow-up for diarrhea lower abdominal pain and weight loss.  Database and subjective:  Patient is a 57 year old Caucasian male who is here for follow-up visit.  He was initially seen on 02/10/2018 for GERD symptoms not controlled with PPI therapy.  He also reported diarrhea.  He thought he had food poisoning but his diarrhea has never gone away.  He also has been experiencing lower abdominal pain and weight loss.  He says when he saw Dr. Nadara Mustard sometime in June this year he weighed 201 pounds.  On his last visit to our office on 02/10/2018 he weighed 184 pounds.  He has lost 25 pounds in the last 4 months and 8 pounds since his last visit about 10 weeks ago.  He states he has a good appetite. He is generally has 3-4 bowel movements.  Most of his bowel movements occur within 1530 minutes of his meals.  He has nausea but no vomiting or fever.  Pain is across lower abdomen and he does get some relief with his bowel movement.  He also complains of profuse night sweats which started about a year ago.  At least once a month he has got up and changes close.  On couple of occasions he went had to take a shower. He recalls he had blood work by Dr. Rory Percy and it was normal. Since his last visit he was treated with antibiotic for 5 days for focal cellulitis after he had tetanus toxoid.  He did not notice any change in his diarrhea.  He also complains of excessive flatus and order has changed. He does not exercise because he is afraid exercise would result in recurrent V. tach.   His GI work-up has been as follows.  He had a EGD and colonoscopy on 03/12/2018.  He had reactive gastropathy fundic gland polyps and biopsy from esophagus was negative for Barrett's.  He had 5 colonic polyps and all of these were tubular adenomas.  He underwent abdominal pelvic CT on 03/26/2018 which reveals dilation to loops of small bowel but distal small bowel caliber was normal.  There was no adenopathy or  pancreatic abnormality.  He returned for CT enterography on 04/15/2018 and no small bowel abnormality was noted.   Current Medications: Outpatient Encounter Medications as of 04/28/2018  Medication Sig  . aspirin EC 81 MG tablet Take 1 tablet (81 mg total) by mouth daily.  . DULoxetine (CYMBALTA) 30 MG capsule Take 1 capsule (30 mg total) by mouth 2 (two) times daily.  Marland Kitchen gabapentin (NEURONTIN) 300 MG capsule Take 1-2 capsules (300-600 mg total) by mouth daily as needed. (Patient taking differently: Take 300-600 mg by mouth daily as needed (for pain). )  . gabapentin (NEURONTIN) 800 MG tablet TAKE ONE TABLET BY MOUTH TWICE DAILY  . lisinopril (PRINIVIL,ZESTRIL) 20 MG tablet TAKE ONE TABLET BY MOUTH AT BEDTIME  . pantoprazole (PROTONIX) 40 MG tablet Take 1 tablet (40 mg total) by mouth 2 (two) times daily before a meal.  . rosuvastatin (CRESTOR) 5 MG tablet Take 5 mg by mouth at bedtime.   . traZODone (DESYREL) 100 MG tablet TAKE ONE TABLET BY MOUTH AT BEDTIME.  . verapamil (CALAN-SR) 240 MG CR tablet TAKE ONE TABLET BY MOUTH DAILY.   No facility-administered encounter medications on file as of 04/28/2018.      Objective: Blood pressure 110/80, pulse 67, temperature 98 F (36.7 C), temperature source Oral, resp. rate 18, height 5\' 8"  (1.727 m), weight 176 lb 12.8 oz (  80.2 kg). Patient is alert and in no acute distress. Conjunctiva is pink. Sclera is nonicteric Oropharyngeal mucosa is normal. No neck masses or thyromegaly noted. Cardiac exam with regular rhythm normal S1 and S2. No murmur or gallop noted. Lungs are clear to auscultation. Abdomen is symmetrical.  Bowel sounds are normal.  No bruits noted.  On palpation abdomen is soft.  He has mild tenderness in LLQ without any guarding.  No organomegaly or masses. No LE edema or clubbing noted.  Labs/studies Results:  Imaging studies as above  Assessment:  #1.  Diarrhea and weight loss worrisome for mild absorptive syndrome.  Recent  colonoscopy was negative for colitis. Recent EGD revealed normal-appearing duodenal mucosa.  His symptoms are not suggestive of IBS.  He could have small intestinal bacterial overgrowth given finding of somewhat dilated small bowel on CT.  He could also have B12 deficiency and also need to rule out Addison's disease given his weight loss.  #2.  Chronic GERD.  He is doing well with double dose PPI.  Dose will be decreased down the road.  Plan:  Fasting cortisol level. CBC with differential, sed rate and serum B12 levels. Cipro 500 mg p.o. twice daily for 10 days. Patient will daily symptom diary and call with progress report in 2 weeks. Office visit in 3 months.

## 2018-04-29 DIAGNOSIS — R197 Diarrhea, unspecified: Secondary | ICD-10-CM | POA: Diagnosis not present

## 2018-04-29 DIAGNOSIS — R634 Abnormal weight loss: Secondary | ICD-10-CM | POA: Diagnosis not present

## 2018-04-29 DIAGNOSIS — D519 Vitamin B12 deficiency anemia, unspecified: Secondary | ICD-10-CM | POA: Diagnosis not present

## 2018-04-29 DIAGNOSIS — Z79899 Other long term (current) drug therapy: Secondary | ICD-10-CM | POA: Diagnosis not present

## 2018-04-30 LAB — CBC WITH DIFFERENTIAL/PLATELET
BASOS PCT: 0.7 %
Basophils Absolute: 32 cells/uL (ref 0–200)
EOS PCT: 2.8 %
Eosinophils Absolute: 129 cells/uL (ref 15–500)
HCT: 42.7 % (ref 38.5–50.0)
Hemoglobin: 14.2 g/dL (ref 13.2–17.1)
LYMPHS ABS: 1513 {cells}/uL (ref 850–3900)
MCH: 28.6 pg (ref 27.0–33.0)
MCHC: 33.3 g/dL (ref 32.0–36.0)
MCV: 86.1 fL (ref 80.0–100.0)
MONOS PCT: 8.1 %
MPV: 10.9 fL (ref 7.5–12.5)
NEUTROS ABS: 2553 {cells}/uL (ref 1500–7800)
NEUTROS PCT: 55.5 %
PLATELETS: 198 10*3/uL (ref 140–400)
RBC: 4.96 10*6/uL (ref 4.20–5.80)
RDW: 12.9 % (ref 11.0–15.0)
TOTAL LYMPHOCYTE: 32.9 %
WBC mixed population: 373 cells/uL (ref 200–950)
WBC: 4.6 10*3/uL (ref 3.8–10.8)

## 2018-04-30 LAB — CORTISOL-AM, BLOOD: CORTISOL - AM: 19.6 ug/dL

## 2018-04-30 LAB — SEDIMENTATION RATE: Sed Rate: 2 mm/h (ref 0–20)

## 2018-04-30 LAB — VITAMIN B12: VITAMIN B 12: 313 pg/mL (ref 200–1100)

## 2018-05-07 ENCOUNTER — Telehealth: Payer: Self-pay | Admitting: *Deleted

## 2018-05-07 MED ORDER — TRAZODONE HCL 100 MG PO TABS: 100.0000 mg | ORAL_TABLET | Freq: Every day | ORAL | 1 refills | Status: DC

## 2018-05-07 NOTE — Telephone Encounter (Signed)
Received request from [facility] mail order to send 90 days supply trazodone 100mg  tab. Spoke with Dr. Jannifer Franklin and got VO to send.   I called [facility] and spoke with Pam who transferred me to [facility] (pharmacist). She cx any remaining fills on trazodone. Advised we are sending to mail order. She verbalized understanding.

## 2018-05-16 MED ORDER — GABAPENTIN 300 MG PO CAPS: 300.0000 mg | ORAL_CAPSULE | Freq: Every day | ORAL | 1 refills | Status: DC | PRN

## 2018-05-16 NOTE — Addendum Note (Signed)
Addended by: Lester Mancos A on: 05/16/2018 10:30 AM   Modules accepted: Orders

## 2018-05-16 NOTE — Telephone Encounter (Signed)
Received request for pt's gabapentin 300 mg capsule to go to [facility]. I called [facility], spoke to [facility], advised her that the gabapentin 300mg  capsule will be going to [place] and asked [facility] to no longer refill the gabapentin 300mg . Cyril Mourning verbalized understanding.

## 2018-07-09 DIAGNOSIS — R0681 Apnea, not elsewhere classified: Secondary | ICD-10-CM | POA: Diagnosis not present

## 2018-07-18 DIAGNOSIS — G4733 Obstructive sleep apnea (adult) (pediatric): Secondary | ICD-10-CM | POA: Diagnosis not present

## 2018-07-29 DIAGNOSIS — G4733 Obstructive sleep apnea (adult) (pediatric): Secondary | ICD-10-CM | POA: Diagnosis not present

## 2018-08-05 ENCOUNTER — Ambulatory Visit (INDEPENDENT_AMBULATORY_CARE_PROVIDER_SITE_OTHER): Payer: Medicare HMO | Admitting: Internal Medicine

## 2018-08-05 ENCOUNTER — Encounter (INDEPENDENT_AMBULATORY_CARE_PROVIDER_SITE_OTHER): Payer: Self-pay | Admitting: Internal Medicine

## 2018-08-05 VITALS — BP 124/90 | HR 67 | Temp 98.2°F | Resp 18 | Ht 68.0 in | Wt 185.5 lb

## 2018-08-05 DIAGNOSIS — R634 Abnormal weight loss: Secondary | ICD-10-CM

## 2018-08-05 DIAGNOSIS — K219 Gastro-esophageal reflux disease without esophagitis: Secondary | ICD-10-CM | POA: Diagnosis not present

## 2018-08-05 DIAGNOSIS — R1013 Epigastric pain: Secondary | ICD-10-CM | POA: Diagnosis not present

## 2018-08-05 DIAGNOSIS — K582 Mixed irritable bowel syndrome: Secondary | ICD-10-CM | POA: Diagnosis not present

## 2018-08-05 MED ORDER — DICYCLOMINE HCL 10 MG PO CAPS: 10.0000 mg | ORAL_CAPSULE | Freq: Three times a day (TID) | ORAL | 1 refills | Status: DC | PRN

## 2018-08-05 NOTE — Progress Notes (Signed)
Presenting complaint;  Follow-up for GERD epigastric pain and weight loss.  Database and subjective:  Patient is 57 year old Caucasian male who was evaluated in July this year for 30 pound weight loss occurring over a period of 3 to 4 months poorly controlled GERD symptoms poor appetite and epigastric pain. EGD revealed erosive gastritis multiple gastric polyps which are fundic gland polyps on biopsy.  Antral biopsy showed reactive changes but no H. pylori.  Esophageal biopsy was negative for Barrett's.  He had sigmoid colon diverticulosis and 5 small polyps removed and these were all tubular adenomas.  Abdominopelvic CT was obtained which revealed dilated loops of small bowel with transition in right lower quadrant.  There was no discrete stricture.  This finding was worrisome in light of patient's symptoms.  He therefore had CT enterography on 04/15/2018 and the study was unremarkable.  Further testing included normal B12 level and fasting cortisol levels and sed rate was 2.  He was treated with Cipro for 10 days and he started to feel better.  He states he is doing much better.  He has gained 9 pounds since his last visit of 04/28/2018.  He says his appetite has improved somewhat.  He eats big [place] roll every morning and ice cream at night.  He is eating chicken mostly in he has no problem.  He continues to complain of intermittent pain across epigastric region pain is relieved when his bowels move.  His bowels are irregular.  He has constipation and diarrhea.  When he is constipated he may go 1 or 2 days without a bowel movement and when he has diarrhea he may have 3-4 bowel movements per day.  He says he has spontaneous relief without having to take any medications.  He says he was reading about IBS syndrome while sitting in the waiting area and wonders if he has IBS.  He denies fever chills or night sweats.  He states he was begun on CPAP last week and he has been able to sleep much better. His  heartburn is well controlled with double dose PPI.    Current Medications: Outpatient Encounter Medications as of 08/05/2018  Medication Sig  . aspirin EC 81 MG tablet Take 1 tablet (81 mg total) by mouth daily.  . DULoxetine (CYMBALTA) 30 MG capsule Take 1 capsule (30 mg total) by mouth 2 (two) times daily.  Marland Kitchen gabapentin (NEURONTIN) 300 MG capsule Take 1-2 capsules (300-600 mg total) by mouth daily as needed.  . gabapentin (NEURONTIN) 800 MG tablet TAKE ONE TABLET BY MOUTH TWICE DAILY  . lisinopril (PRINIVIL,ZESTRIL) 20 MG tablet TAKE ONE TABLET BY MOUTH AT BEDTIME  . pantoprazole (PROTONIX) 40 MG tablet Take 1 tablet (40 mg total) by mouth 2 (two) times daily before a meal.  . rosuvastatin (CRESTOR) 5 MG tablet Take 5 mg by mouth at bedtime.   . traZODone (DESYREL) 100 MG tablet Take 1 tablet (100 mg total) by mouth at bedtime.  . verapamil (CALAN-SR) 240 MG CR tablet TAKE ONE TABLET BY MOUTH DAILY.  . [DISCONTINUED] ciprofloxacin (CIPRO) 500 MG tablet Take 1 tablet (500 mg total) by mouth 2 (two) times daily. (Patient not taking: Reported on 08/05/2018)   No facility-administered encounter medications on file as of 08/05/2018.      Objective: Blood pressure 124/90, pulse 67, temperature 98.2 F (36.8 C), temperature source Oral, resp. rate 18, height 5\' 8"  (1.727 m), weight 185 lb 8 oz (84.1 kg). Patient is alert and in no acute distress.  Conjunctiva is pink. Sclera is nonicteric Oropharyngeal mucosa is normal. No neck masses or thyromegaly noted. Cardiac exam with regular rhythm normal S1 and S2. No murmur or gallop noted. Lungs are clear to auscultation. Abdomen is symmetrical.  Bowel sounds are normal.  He has mild midepigastric tenderness.  No organomegaly or masses. No LE edema or clubbing noted.   Assessment:  #1.  Weight loss.  He is now gaining weight which is reassuring.  I wonder if he had small intestinal bacterial overgrowth leading to some of his symptoms since he  responded after 10-day course with Cipro.  #2.  Epigastric pain.  Recent EGD was negative for peptic ulcer disease or H. pylori gastritis.  Pain may be due to IBS or dyspepsia.  #3.  GERD.  He is doing much better with double dose PPI.  We will plan to drop dose at the time of next office visit.   #4.  Irritable bowel syndrome.  He has typical symptoms.  He is having diarrhea alternating with constipation.  Recent colonoscopy was negative for colitis.     Plan:  To pantoprazole at current dose of 40 mg p.o. twice daily. Dicyclomine 10 mg p.o. 3 times daily PRN. Dulcolax suppository to be used on as-needed basis. Weight check in 2 months. Office visit in 4 months.

## 2018-08-05 NOTE — Patient Instructions (Addendum)
Can use Dulcolax suppository on as-needed basis. Weight check in 2 months.

## 2018-08-20 ENCOUNTER — Ambulatory Visit: Payer: Medicare HMO | Admitting: Adult Health

## 2018-08-20 ENCOUNTER — Encounter: Payer: Self-pay | Admitting: Adult Health

## 2018-08-20 VITALS — BP 123/75 | HR 78 | Ht 68.0 in | Wt 188.8 lb

## 2018-08-20 DIAGNOSIS — M797 Fibromyalgia: Secondary | ICD-10-CM

## 2018-08-20 MED ORDER — TRAZODONE HCL 100 MG PO TABS: 100.0000 mg | ORAL_TABLET | Freq: Every day | ORAL | 3 refills | Status: DC

## 2018-08-20 MED ORDER — GABAPENTIN 300 MG PO CAPS: 300.0000 mg | ORAL_CAPSULE | Freq: Every day | ORAL | 3 refills | Status: DC | PRN

## 2018-08-20 MED ORDER — GABAPENTIN 800 MG PO TABS: 800.0000 mg | ORAL_TABLET | Freq: Two times a day (BID) | ORAL | 3 refills | Status: DC

## 2018-08-20 NOTE — Patient Instructions (Addendum)
Your Plan:  Continue Gabapentin and trazodone Try to wean off Cymbalta- decrease dose to 1 tablet daily for 1-2 weeks. Then take 1 tablet every other day for 1 week then every 2 days for 1 week then stop medication.  If your symptoms worsen or you develop new symptoms please let us know.   Thank you for coming to see [place] at [facility]. I hope we have been able to provide you high quality care today.  You may receive a patient satisfaction survey over the next few weeks. We would appreciate your feedback and comments so that we may continue to improve ourselves and the health of our patients.

## 2018-08-20 NOTE — Progress Notes (Signed)
PATIENT: Anthony Sherman DOB: 02-Jun-1961  REASON FOR VISIT: follow up HISTORY FROM: patient  HISTORY OF PRESENT ILLNESS: Today 08/20/18:  Anthony Sherman is a 58 year old male with a history of fibromyalgia.  He returns today for follow-up.  He is currently taking 800 mg of gabapentin twice a day and taking 3 to 600 mg at lunch if needed.  At the last visit we increase Cymbalta 30 mg twice a day.  He reports that he has not really noticed any change since adding the Cymbalta.  He does also note that he has a rash on his stomach and in his legs that pops up intermittently.  He states that is not painful but it does itch.  He is unsure if this is related to the additional dose of Cymbalta.  He continues to state most of his pain is in his hands.  Particularly around the thumb.  He also states that at night he has pain in the hips.  He reports that his sleep has improved since he started on CPAP.  He continues to take trazodone at bedtime.   HISTORY 02/27/18:  Anthony Sherman is a 58 year old male with a history of fibromyalgia.  He returns today for follow-up.  He states that the extra gabapentin has been beneficial.  He reports that he typically takes it at lunch.  There are times that he takes 600 mg instead of 300 and this tends to work well for him.  He states on a daily basis he has constant pain in his hands shoulders and legs.  He rates his pain a 5 out of 10.  He states at least twice a week his pain will increase to 8 out of 10.  He tries to maintain some physical activity.  He states that he still is not sleeping well at night despite being on trazodone.  He returns today for evaluation.   REVIEW OF SYSTEMS: Out of a complete 14 system review of symptoms, the patient complains only of the following symptoms, and all other reviewed systems are negative.  Light sensitivity, shortness of breath, chest pain, ringing in ears, apnea, abdominal pain, cold intolerance, heat intolerance,  dizziness, numbness, agitation, joint pain, joint swelling, muscle cramps, itching  ALLERGIES: Allergies  Allergen Reactions  . Flecainide Other (See Comments)    SOB 2014  . Multaq [Dronedarone] Other (See Comments)    Nightmares    HOME MEDICATIONS: Outpatient Medications Prior to Visit  Medication Sig Dispense Refill  . aspirin EC 81 MG tablet Take 1 tablet (81 mg total) by mouth daily.    Marland Kitchen dicyclomine (BENTYL) 10 MG capsule Take 1 capsule (10 mg total) by mouth 3 (three) times daily as needed. 90 capsule 1  . DULoxetine (CYMBALTA) 30 MG capsule Take 1 capsule (30 mg total) by mouth 2 (two) times daily. 60 capsule 5  . gabapentin (NEURONTIN) 300 MG capsule Take 1-2 capsules (300-600 mg total) by mouth daily as needed. 90 capsule 1  . gabapentin (NEURONTIN) 800 MG tablet TAKE ONE TABLET BY MOUTH TWICE DAILY 180 tablet 1  . lisinopril (PRINIVIL,ZESTRIL) 20 MG tablet TAKE ONE TABLET BY MOUTH AT BEDTIME 30 tablet 2  . pantoprazole (PROTONIX) 40 MG tablet Take 1 tablet (40 mg total) by mouth 2 (two) times daily before a meal. 60 tablet 3  . rosuvastatin (CRESTOR) 5 MG tablet Take 5 mg by mouth at bedtime.     . traZODone (DESYREL) 100 MG tablet Take 1 tablet (100 mg  total) by mouth at bedtime. 90 tablet 1  . verapamil (CALAN-SR) 240 MG CR tablet TAKE ONE TABLET BY MOUTH DAILY. 90 tablet 3   No facility-administered medications prior to visit.     PAST MEDICAL HISTORY: Past Medical History:  Diagnosis Date  . Chronic insomnia 08/30/2017  . Essential hypertension, benign   . Fibromyalgia   . GERD (gastroesophageal reflux disease)   . Mixed hyperlipidemia   . POTS (postural orthostatic tachycardia syndrome)   . Premature ventricular contractions   . Syncope    2010    PAST SURGICAL HISTORY: Past Surgical History:  Procedure Laterality Date  . APPENDECTOMY  ~ 2000  . BIOPSY  03/12/2018   Procedure: BIOPSY;  Surgeon: Rogene Houston, MD;  Location: [facility];  Service:  Endoscopy;;  gastric esophageal  . CHOLECYSTECTOMY  ~ 2000  . COLONOSCOPY N/A 03/12/2018   Procedure: COLONOSCOPY;  Surgeon: Rogene Houston, MD;  Location: [facility];  Service: Endoscopy;  Laterality: N/A;  . ESOPHAGOGASTRODUODENOSCOPY N/A 03/12/2018   Procedure: ESOPHAGOGASTRODUODENOSCOPY (EGD);  Surgeon: Rogene Houston, MD;  Location: [facility];  Service: Endoscopy;  Laterality: N/A;  7:30  . LEFT HEART CATHETERIZATION WITH CORONARY ANGIOGRAM N/A 01/07/2013   Procedure: LEFT HEART CATHETERIZATION WITH CORONARY ANGIOGRAM;  Surgeon: Minus Breeding, MD;  Location: [facility] CATH LAB;  Service: Cardiovascular;  Laterality: N/A;  . POLYPECTOMY  03/12/2018   Procedure: POLYPECTOMY;  Surgeon: Rogene Houston, MD;  Location: [facility];  Service: Endoscopy;;  colon  . PVC ablation  04/14/13   parahisian PVC focus ablated by Dr Rayann Heman  . SUPRAVENTRICULAR TACHYCARDIA ABLATION N/A 04/14/2013   Procedure: VT Ablation;  Surgeon: Thompson Grayer, MD;  Location: [facility] CATH LAB;  Service: Cardiovascular;  Laterality: N/A;    FAMILY HISTORY: Family History  Problem Relation Age of Onset  . CAD Mother   . Rheum arthritis Mother   . Colon cancer Father   . Atrial fibrillation Father   . Breast cancer Sister   . Lung cancer Sister   . Heart disease Maternal Grandfather   . Scleroderma Brother     SOCIAL HISTORY: Social History   Socioeconomic History  . Marital status: Married    Spouse name: Not on file  . Number of children: 2  . Years of education: Some college  . Highest education level: Not on file  Occupational History  . Occupation: N/A  Social Needs  . Financial resource strain: Not on file  . Food insecurity:    Worry: Not on file    Inability: Not on file  . Transportation needs:    Medical: Not on file    Non-medical: Not on file  Tobacco Use  . Smoking status: Never Smoker  . Smokeless tobacco: Never Used  Substance and Sexual Activity  . Alcohol use: No  . Drug use: No  .  Sexual activity: Yes    Partners: Female    Comment: Married  Lifestyle  . Physical activity:    Days per week: Not on file    Minutes per session: Not on file  . Stress: Not on file  Relationships  . Social connections:    Talks on phone: Not on file    Gets together: Not on file    Attends religious service: Not on file    Active member of club or organization: Not on file    Attends meetings of clubs or organizations: Not on file    Relationship status:  Not on file  . Intimate partner violence:    Fear of current or ex partner: Not on file    Emotionally abused: Not on file    Physically abused: Not on file    Forced sexual activity: Not on file  Other Topics Concern  . Not on file  Social History Narrative   Lives at home w/ his wife   Right-handed   Caffeine: 4 drinks per day      PHYSICAL EXAM  Vitals:   08/20/18 0709  BP: 123/75  Pulse: 78  Weight: 188 lb 12.8 oz (85.6 kg)  Height: 5\' 8"  (1.727 m)   Body mass index is 28.71 kg/m.  Generalized: Well developed, in no acute distress   Neurological examination  Mentation: Alert oriented to time, place, history taking. Follows all commands speech and language fluent Cranial nerve II-XII: Pupils were equal round reactive to light. Extraocular movements were full, visual field were full on confrontational test. Facial sensation and strength were normal. Uvula tongue midline. Head turning and shoulder shrug  were normal and symmetric. Motor: The motor testing reveals 5 over 5 strength of all 4 extremities. Good symmetric motor tone is noted throughout.  Sensory: Sensory testing is intact to soft touch on all 4 extremities. No evidence of extinction is noted.  Coordination: Cerebellar testing reveals good finger-nose-finger and heel-to-shin bilaterally.  Gait and station: Gait is normal.  Reflexes: Deep tendon reflexes are symmetric and normal bilaterally.   DIAGNOSTIC DATA (LABS, IMAGING, TESTING) - I reviewed  patient records, labs, notes, testing and imaging myself where available.  Lab Results  Component Value Date   WBC 4.6 04/29/2018   HGB 14.2 04/29/2018   HCT 42.7 04/29/2018   MCV 86.1 04/29/2018   PLT 198 04/29/2018      Component Value Date/Time   NA 137 07/03/2013 1040   K 4.8 07/03/2013 1040   CL 103 07/03/2013 1040   CO2 26 07/03/2013 1040   GLUCOSE 93 07/03/2013 1040   BUN 15 07/03/2013 1040   CREATININE 1.10 03/26/2018 1213   CALCIUM 10.0 07/03/2013 1040   No results found for: CHOL, HDL, LDLCALC, LDLDIRECT, TRIG, CHOLHDL No results found for: HGBA1C Lab Results  Component Value Date   VITAMINB12 313 04/29/2018   No results found for: TSH    ASSESSMENT AND PLAN 58 y.o. year old male  has a past medical history of Chronic insomnia (08/30/2017), Essential hypertension, benign, Fibromyalgia, GERD (gastroesophageal reflux disease), Mixed hyperlipidemia, POTS (postural orthostatic tachycardia syndrome), Premature ventricular contractions, and Syncope. here with:  1.  Fibromyalgia  The patient will continue on gabapentin taking 800 mg twice a day.  He will take gabapentin 3 to 600 mg at lunch if needed.  We will slowly wean off of Cymbalta.  He will decrease his dose to 1 tablet daily for 1 to 2 weeks.  He can then decrease his dose to 1 tablet every other day for 1 week then every 2 days for 1 week and then stop the medication.  I have advised that if his symptoms worsen or he develops new symptoms he should let us know.  He will follow-up in 1 year or sooner if needed.  I spent 15 minutes with the patient. 50% of this time was spent discussing his medication regimen.   Ward Givens, MSN, NP-C 08/20/2018, 7:17 AM [facility] [address], [place], [place] [address] [phone]

## 2018-08-20 NOTE — Progress Notes (Signed)
I have read the note, and I agree with the clinical assessment and plan.  Genora Arp K Mohamad Bruso   

## 2018-08-29 DIAGNOSIS — G4733 Obstructive sleep apnea (adult) (pediatric): Secondary | ICD-10-CM | POA: Diagnosis not present

## 2018-09-29 DIAGNOSIS — G4733 Obstructive sleep apnea (adult) (pediatric): Secondary | ICD-10-CM | POA: Diagnosis not present

## 2018-10-16 DIAGNOSIS — G4733 Obstructive sleep apnea (adult) (pediatric): Secondary | ICD-10-CM | POA: Diagnosis not present

## 2018-10-28 DIAGNOSIS — G4733 Obstructive sleep apnea (adult) (pediatric): Secondary | ICD-10-CM | POA: Diagnosis not present

## 2018-10-31 DIAGNOSIS — G4733 Obstructive sleep apnea (adult) (pediatric): Secondary | ICD-10-CM | POA: Diagnosis not present

## 2018-10-31 IMAGING — CT CT ABD-PELV W/ CM
2 of 5 series · 15 of 46 positions shown, 17 images · IV contrast (Isovue)
Comparison: CT chest 03/15/2015

CLINICAL DATA: Epigastric pain and loss of weight.

EXAM:
CT ABDOMEN AND PELVIS WITH CONTRAST
TECHNIQUE: Multidetector CT imaging of the abdomen and pelvis was performed
using the standard protocol following bolus administration of
intravenous contrast.
CONTRAST:  100mL [id number] IOPAMIDOL ([id number]) INJECTION 61%

[Series 2: axial st · axial · 0.72mm/px · z∈[-620,-150]mm · 12 of 106 slices shown, 14 images]
[im 6/106  soft-tissue]
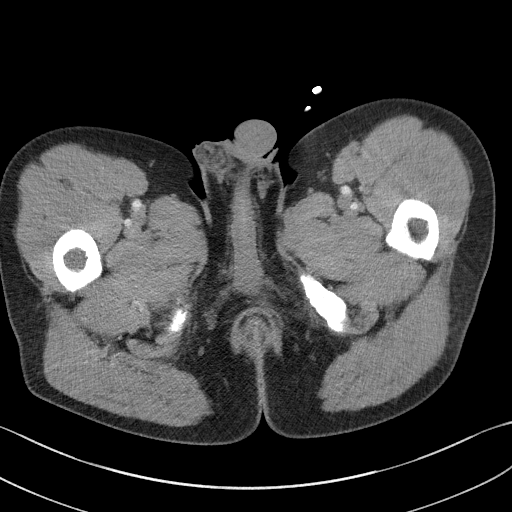
[im 6/106  bone]
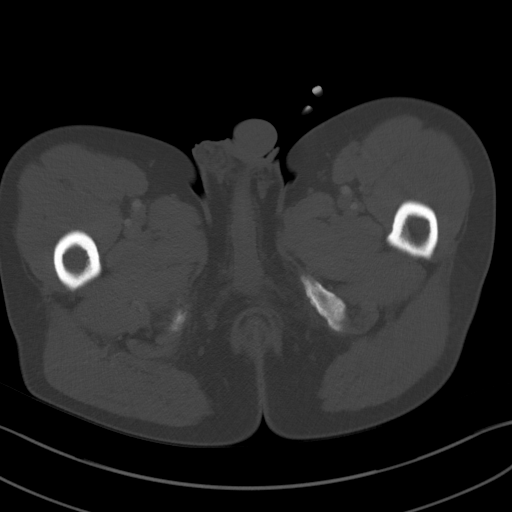
[im 18/106  soft-tissue]
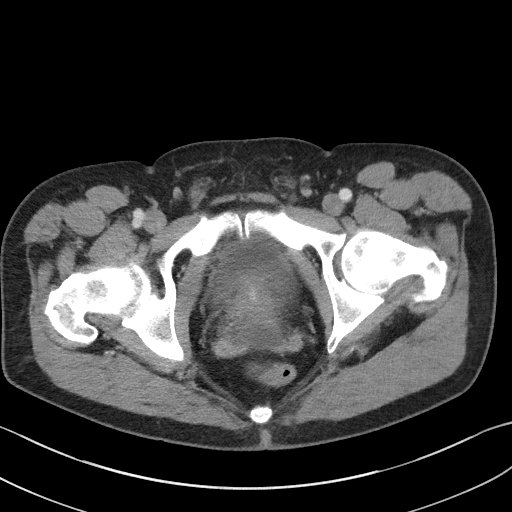
[im 24/106  soft-tissue]
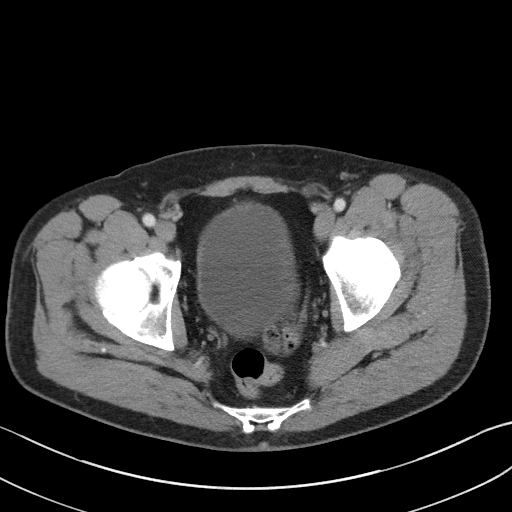
[im 30/106  soft-tissue]
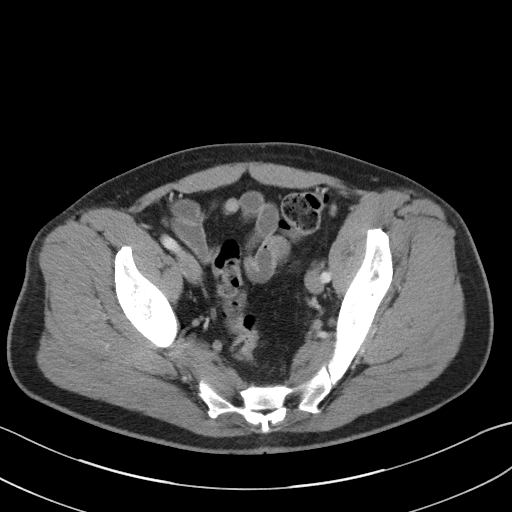
[im 41/106  soft-tissue]
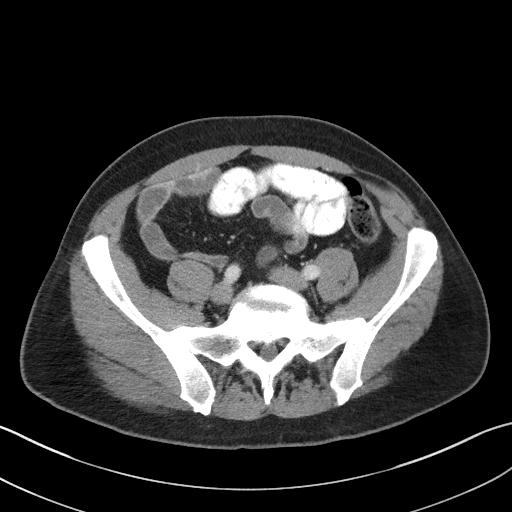
[im 47/106  soft-tissue]
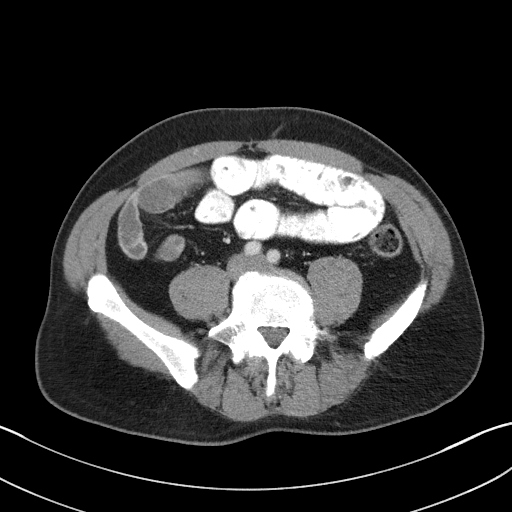
[im 59/106  soft-tissue]
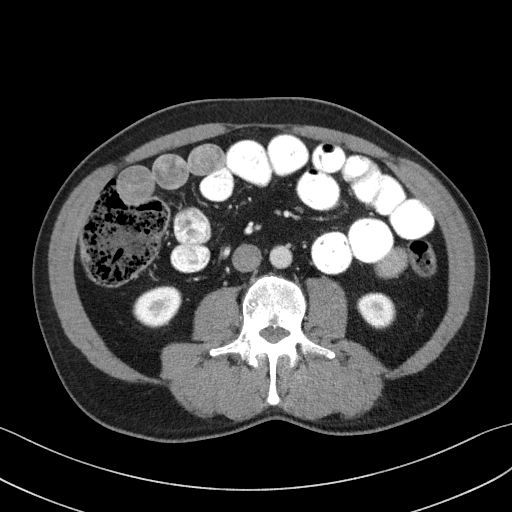
[im 65/106  soft-tissue]
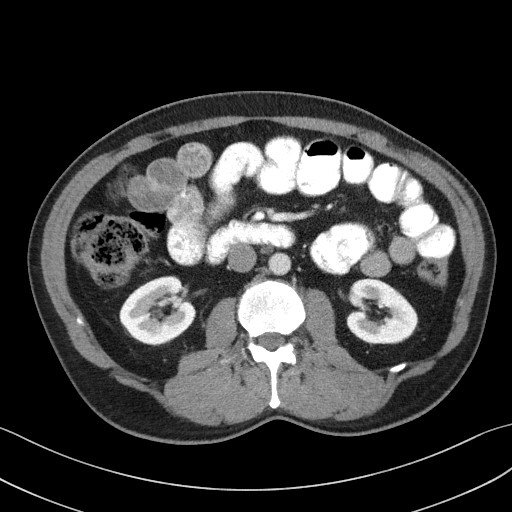
[im 76/106  soft-tissue]
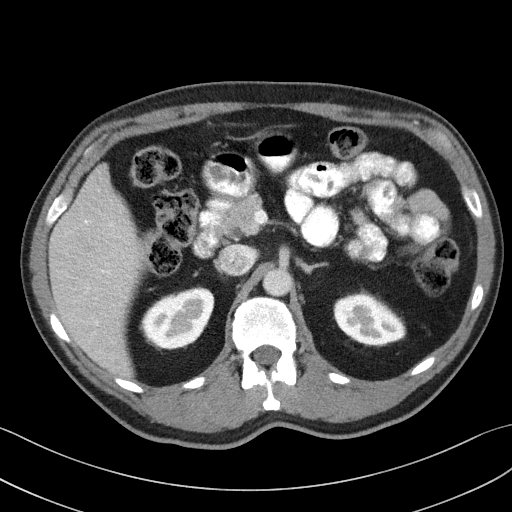
[im 76/106  bone]
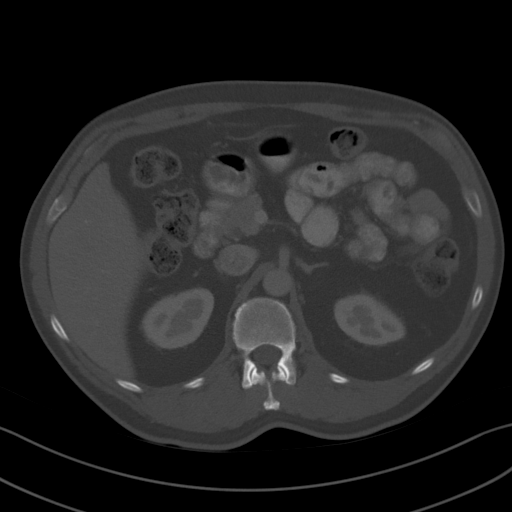
[im 82/106  soft-tissue]
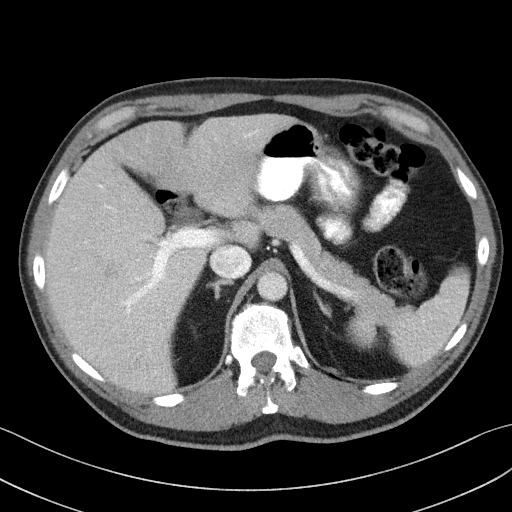
[im 88/106  soft-tissue]
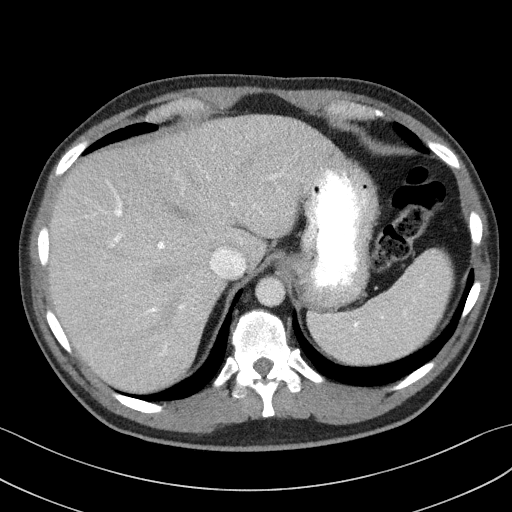
[im 100/106  soft-tissue]
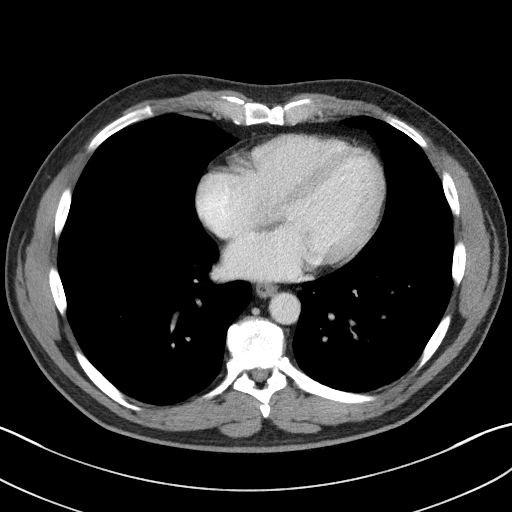

[Series 6: coronal st · coronal · 0.76mm/px · 3 of 90 slices shown]
[im 30/90  soft-tissue]
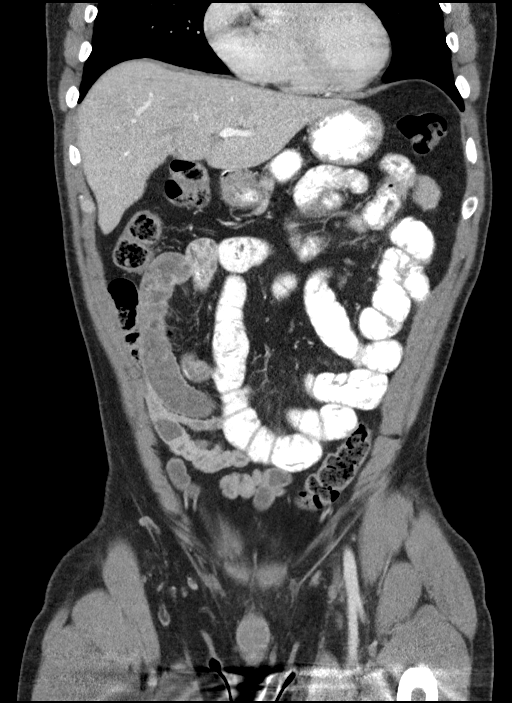
[im 40/90  soft-tissue]
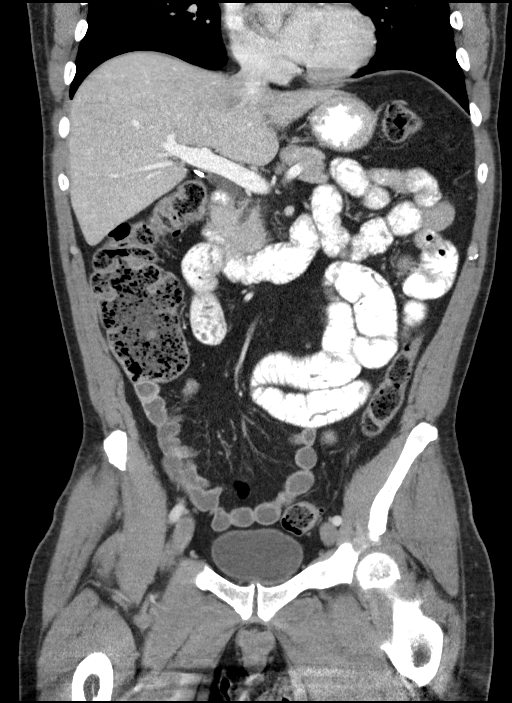
[im 50/90  soft-tissue]
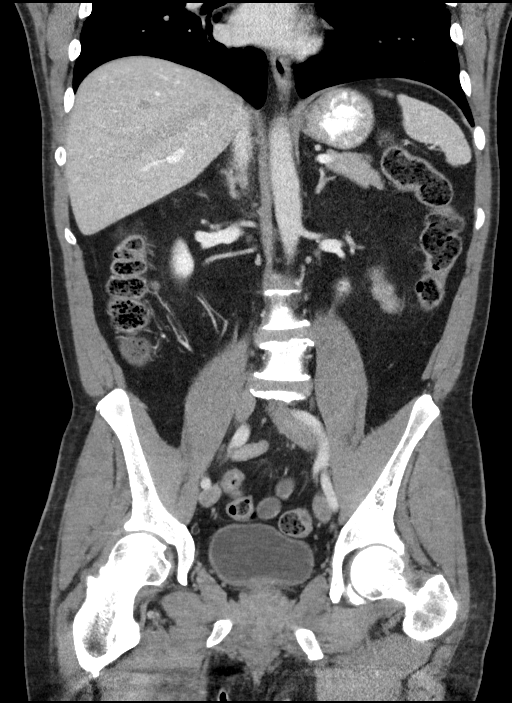

[15 of 46 positions shown; findings below may reference images not displayed]

FINDINGS: Lower chest: 3 mm subpleural nodule overlies the lateral left lower
lobe, image [DATE]. Unchanged from previous exam compatible with a
benign abnormality.

Hepatobiliary: No focal liver abnormality is seen. Status post
cholecystectomy. No biliary dilatation.

Pancreas: Unremarkable. No pancreatic ductal dilatation or
surrounding inflammatory changes.

Spleen: Normal in size without focal abnormality.

Adrenals/Urinary Tract: Normal adrenal glands.

The kidneys are unremarkable. No mass or hydronephrosis. Urinary
bladder appears normal.

Stomach/Bowel: Stomach is unremarkable. The proximal small bowel
loops are mildly increased in caliber measuring up to 3.0 cm, image
48/2. Within the right lower quadrant of the abdomen the bowel loops
decrease in caliber from 2.4 cm 2 1.1 cm. Normal appearance of the
terminal ileum. Unremarkable appearance of the colon.

Vascular/Lymphatic: Normal appearance of the abdominal aorta. No
enlarged retroperitoneal or mesenteric adenopathy. No enlarged
pelvic or inguinal lymph nodes.

Reproductive: Prostate is unremarkable.

Other: No free fluid or fluid collections identified.

Musculoskeletal: Spondylosis noted within the lumbar spine. No
suspicious bone lesions.
IMPRESSION: 1. The proximal and mid small bowel loops appear increased in
caliber measuring up to 3 cm with a transition to normal to
decreased caliber distal small bowel within the right lower quadrant
of the abdomen. The appearance is nonspecific but may represent
partial small bowel obstruction perhaps secondary to adhesive
disease. No underlying mass lesion is identified.
2. No mass or adenopathy noted within the abdomen or pelvis.

## 2018-11-28 DIAGNOSIS — G4733 Obstructive sleep apnea (adult) (pediatric): Secondary | ICD-10-CM | POA: Diagnosis not present

## 2018-11-28 DIAGNOSIS — M797 Fibromyalgia: Secondary | ICD-10-CM | POA: Diagnosis not present

## 2018-11-28 DIAGNOSIS — I498 Other specified cardiac arrhythmias: Secondary | ICD-10-CM | POA: Diagnosis not present

## 2018-11-28 DIAGNOSIS — R000 Tachycardia, unspecified: Secondary | ICD-10-CM | POA: Diagnosis not present

## 2018-12-02 ENCOUNTER — Other Ambulatory Visit: Payer: Self-pay

## 2018-12-02 ENCOUNTER — Ambulatory Visit (INDEPENDENT_AMBULATORY_CARE_PROVIDER_SITE_OTHER): Payer: Medicare HMO | Admitting: Internal Medicine

## 2018-12-02 ENCOUNTER — Encounter (INDEPENDENT_AMBULATORY_CARE_PROVIDER_SITE_OTHER): Payer: Self-pay | Admitting: Internal Medicine

## 2018-12-02 VITALS — Ht 68.0 in | Wt 180.0 lb

## 2018-12-02 DIAGNOSIS — R1013 Epigastric pain: Secondary | ICD-10-CM

## 2018-12-02 DIAGNOSIS — K219 Gastro-esophageal reflux disease without esophagitis: Secondary | ICD-10-CM

## 2018-12-02 DIAGNOSIS — K589 Irritable bowel syndrome without diarrhea: Secondary | ICD-10-CM

## 2018-12-02 MED ORDER — PANTOPRAZOLE SODIUM 40 MG PO TBEC: 40.0000 mg | DELAYED_RELEASE_TABLET | Freq: Every day | ORAL | 3 refills | Status: DC

## 2018-12-02 NOTE — Progress Notes (Signed)
Virtual Visit via Telephone Note Patient had schedule office visit today to review his GI issues.  Visit was canceled because of ongoing COVID-19 pandemic.  Patient requested tele-visit and I agreed. I connected with Hedda Slade on 12/02/18 at 10:45 AM EDT by telephone and verified that I am speaking with the correct person using two identifiers.   I discussed the limitations, risks, security and privacy concerns of performing an evaluation and management service by telephone and the availability of in person appointments. I also discussed with the patient that there may be a patient responsible charge related to this service. The patient expressed understanding and agreed to proceed.     History of Present Illness:  Patient is 58 year old Caucasian male who was evaluated last year for epigastric weight loss and GERD.  He had lost close to 30 pounds over a period of 3 to 4 months.  He underwent EGD and colonoscopy on 03/12/2018.  EGD revealedFundic gland polyps and antral biopsy revealed reactive changes but no H. pylori.  Colonoscopy revealed colonic diverticulosis and 5 polyps removed these were small and were tubular adenomas. changes of reflux esophagitis and distal esophagus.  Abdominopelvic CT with contrast in September 2019 was unremarkable. In addition to PPI he was treated with dicyclomine and also given 10-day course of Cipro as I felt he may have SIBO. When he was seen on 08/05/2018 he was feeling better and had gained 9 pounds.  He feels much better.  His appetite is good.  He states his weight is ranging between 180 and 182 pounds.  He has had very few episodes of epigastric pain since his last office visit of December 2019.  He has not even had an occasion to take dicyclomine.  His bowel irregularity has improved.  He generally has formed stool daily or every other day.  He denies melena or rectal bleeding.  He has heartburn only with certain foods which he tries to avoid.  He states  since he has been using CPAP he is able to sleep better.  Overall he feels much better.  Medication list was reviewed with the patient and noted to be accurate rates.   Observations/Objective:  Patient reported his weight to be between 180 and 182 pounds.  Assessment and Plan:  #1.  Epigastric pain has just about resolved.  Symptomatic improvement occur following 10-day course of Cipro.  Pain may have been due to SIBO or dyspepsia.  No further work-up unless pain recurs.  #2.  Chronic GERD.  He is doing well.  PPI dose will be reduced to 40 mg daily.  He can use Pepcid OTC 20 mg or OTC antacids for breakthrough symptoms. New prescription for pantoprazole sent to patient's pharmacy.  #3.  History of weight loss.  Is not an issue anymore.  #4 IBS.  He is doing well with dietary changes and not having to use dicyclomine.  Follow Up Instructions:  Office visit on as-needed basis. Next surveillance colonoscopy would be in August 2024. I discussed the assessment and treatment plan with the patient. The patient was provided an opportunity to ask questions and all were answered. The patient agreed with the plan and demonstrated an understanding of the instructions.   The patient was advised to call back or seek an in-person evaluation if the symptoms worsen or if the condition fails to improve as anticipated.  I provided 7 minutes of non-face-to-face time during this encounter.   Hildred Laser, MD

## 2018-12-02 NOTE — Patient Instructions (Signed)
Patient will call if symptoms relapse or he starts to lose weight again. He can take Pepcid OTC or OTC antacids on as-needed basis for breakthrough symptoms.

## 2018-12-28 DIAGNOSIS — G4733 Obstructive sleep apnea (adult) (pediatric): Secondary | ICD-10-CM | POA: Diagnosis not present

## 2019-01-09 DIAGNOSIS — R5383 Other fatigue: Secondary | ICD-10-CM | POA: Diagnosis not present

## 2019-01-09 DIAGNOSIS — K219 Gastro-esophageal reflux disease without esophagitis: Secondary | ICD-10-CM | POA: Diagnosis not present

## 2019-01-09 DIAGNOSIS — I10 Essential (primary) hypertension: Secondary | ICD-10-CM | POA: Diagnosis not present

## 2019-01-09 DIAGNOSIS — E559 Vitamin D deficiency, unspecified: Secondary | ICD-10-CM | POA: Diagnosis not present

## 2019-01-09 DIAGNOSIS — R000 Tachycardia, unspecified: Secondary | ICD-10-CM | POA: Diagnosis not present

## 2019-01-09 DIAGNOSIS — E7800 Pure hypercholesterolemia, unspecified: Secondary | ICD-10-CM | POA: Diagnosis not present

## 2019-01-09 DIAGNOSIS — M797 Fibromyalgia: Secondary | ICD-10-CM | POA: Diagnosis not present

## 2019-01-22 DIAGNOSIS — I498 Other specified cardiac arrhythmias: Secondary | ICD-10-CM | POA: Diagnosis not present

## 2019-01-22 DIAGNOSIS — Z1389 Encounter for screening for other disorder: Secondary | ICD-10-CM | POA: Diagnosis not present

## 2019-01-22 DIAGNOSIS — R911 Solitary pulmonary nodule: Secondary | ICD-10-CM | POA: Diagnosis not present

## 2019-01-22 DIAGNOSIS — Z6830 Body mass index (BMI) 30.0-30.9, adult: Secondary | ICD-10-CM | POA: Diagnosis not present

## 2019-01-22 DIAGNOSIS — E782 Mixed hyperlipidemia: Secondary | ICD-10-CM | POA: Diagnosis not present

## 2019-01-22 DIAGNOSIS — I493 Ventricular premature depolarization: Secondary | ICD-10-CM | POA: Diagnosis not present

## 2019-01-22 DIAGNOSIS — E559 Vitamin D deficiency, unspecified: Secondary | ICD-10-CM | POA: Diagnosis not present

## 2019-01-22 DIAGNOSIS — K589 Irritable bowel syndrome without diarrhea: Secondary | ICD-10-CM | POA: Diagnosis not present

## 2019-01-28 DIAGNOSIS — G4733 Obstructive sleep apnea (adult) (pediatric): Secondary | ICD-10-CM | POA: Diagnosis not present

## 2019-01-29 DIAGNOSIS — R911 Solitary pulmonary nodule: Secondary | ICD-10-CM | POA: Diagnosis not present

## 2019-02-03 DIAGNOSIS — S99922A Unspecified injury of left foot, initial encounter: Secondary | ICD-10-CM | POA: Diagnosis not present

## 2019-02-03 DIAGNOSIS — S99912A Unspecified injury of left ankle, initial encounter: Secondary | ICD-10-CM | POA: Diagnosis not present

## 2019-02-05 DIAGNOSIS — M84475A Pathological fracture, left foot, initial encounter for fracture: Secondary | ICD-10-CM | POA: Diagnosis not present

## 2019-02-05 DIAGNOSIS — S92902A Unspecified fracture of left foot, initial encounter for closed fracture: Secondary | ICD-10-CM | POA: Diagnosis not present

## 2019-02-12 DIAGNOSIS — S92902A Unspecified fracture of left foot, initial encounter for closed fracture: Secondary | ICD-10-CM | POA: Diagnosis not present

## 2019-02-12 DIAGNOSIS — M79672 Pain in left foot: Secondary | ICD-10-CM | POA: Diagnosis not present

## 2019-02-27 DIAGNOSIS — G4733 Obstructive sleep apnea (adult) (pediatric): Secondary | ICD-10-CM | POA: Diagnosis not present

## 2019-03-05 DIAGNOSIS — L259 Unspecified contact dermatitis, unspecified cause: Secondary | ICD-10-CM | POA: Diagnosis not present

## 2019-03-05 DIAGNOSIS — Z6830 Body mass index (BMI) 30.0-30.9, adult: Secondary | ICD-10-CM | POA: Diagnosis not present

## 2019-03-12 DIAGNOSIS — S92902A Unspecified fracture of left foot, initial encounter for closed fracture: Secondary | ICD-10-CM | POA: Diagnosis not present

## 2019-03-12 DIAGNOSIS — S92332D Displaced fracture of third metatarsal bone, left foot, subsequent encounter for fracture with routine healing: Secondary | ICD-10-CM | POA: Diagnosis not present

## 2019-03-12 DIAGNOSIS — M79672 Pain in left foot: Secondary | ICD-10-CM | POA: Diagnosis not present

## 2019-03-30 DIAGNOSIS — G4733 Obstructive sleep apnea (adult) (pediatric): Secondary | ICD-10-CM | POA: Diagnosis not present

## 2019-04-02 ENCOUNTER — Other Ambulatory Visit: Payer: Self-pay | Admitting: Internal Medicine

## 2019-04-09 ENCOUNTER — Telehealth: Payer: Self-pay | Admitting: *Deleted

## 2019-04-09 ENCOUNTER — Encounter: Payer: Self-pay | Admitting: *Deleted

## 2019-04-09 NOTE — Telephone Encounter (Signed)
Patient verbally consented for telehealth visits with [facility] and understands that his insurance company will be billed for the encounter.  Medications and allergies reviewed Weight 184 lbs Will have vitals available tomorrow

## 2019-04-10 ENCOUNTER — Telehealth (INDEPENDENT_AMBULATORY_CARE_PROVIDER_SITE_OTHER): Payer: Medicare HMO | Admitting: Internal Medicine

## 2019-04-10 ENCOUNTER — Encounter: Payer: Self-pay | Admitting: Internal Medicine

## 2019-04-10 VITALS — BP 124/78 | HR 72 | Wt 186.0 lb

## 2019-04-10 DIAGNOSIS — I493 Ventricular premature depolarization: Secondary | ICD-10-CM

## 2019-04-10 DIAGNOSIS — G4733 Obstructive sleep apnea (adult) (pediatric): Secondary | ICD-10-CM

## 2019-04-10 DIAGNOSIS — I10 Essential (primary) hypertension: Secondary | ICD-10-CM

## 2019-04-10 NOTE — Progress Notes (Signed)
Electrophysiology TeleHealth Note   Due to national recommendations of social distancing due to [place] 19, an audio telehealth visit is felt to be most appropriate for this patient at this time.  Verbal consent was obtained by me for the telehealth visit today.  The patient does not have capability for a virtual visit.  A phone visit is therefore required today.   Date:  04/10/2019   ID:  Anthony Sherman, DOB 11/02/1960, MRN [id number]  Location: patient's home  Provider location:  [facility]  Evaluation Performed: Follow-up visit  PCP:  Rory Percy, MD   Electrophysiologist:  Dr Rayann Heman  Chief Complaint:  PVC follow up  History of Present Illness:    Anthony Sherman is a 58 y.o. male who presents via telehealth conferencing today.  Since last being seen in our clinic, the patient reports doing very well.  He has rare palpitations.  "for the most part every thing ok".  Today, he denies symptoms of chest pain, shortness of breath,  lower extremity edema, dizziness, presyncope, or syncope.  The patient is otherwise without complaint today.  The patient denies symptoms of fevers, chills, cough, or new SOB worrisome for COVID 19.  Past Medical History:  Diagnosis Date  . Chronic insomnia 08/30/2017  . Essential hypertension, benign   . Fibromyalgia   . GERD (gastroesophageal reflux disease)   . Mixed hyperlipidemia   . POTS (postural orthostatic tachycardia syndrome)   . Premature ventricular contractions   . Syncope    2010    Past Surgical History:  Procedure Laterality Date  . APPENDECTOMY  ~ 2000  . BIOPSY  03/12/2018   Procedure: BIOPSY;  Surgeon: Rogene Houston, MD;  [facility];  Service: Endoscopy;;  gastric esophageal  . CHOLECYSTECTOMY  ~ 2000  . COLONOSCOPY N/A 03/12/2018   Procedure: COLONOSCOPY;  Surgeon: Rogene Houston, MD;  [facility];  Service: Endoscopy;  Laterality: N/A;  . ESOPHAGOGASTRODUODENOSCOPY N/A 03/12/2018   Procedure:  ESOPHAGOGASTRODUODENOSCOPY (EGD);  Surgeon: Rogene Houston, MD;  [facility];  Service: Endoscopy;  Laterality: N/A;  7:30  . LEFT HEART CATHETERIZATION WITH CORONARY ANGIOGRAM N/A 01/07/2013   Procedure: LEFT HEART CATHETERIZATION WITH CORONARY ANGIOGRAM;  Surgeon: Minus Breeding, MD;  Location: [facility] CATH LAB;  Service: Cardiovascular;  Laterality: N/A;  . POLYPECTOMY  03/12/2018   Procedure: POLYPECTOMY;  Surgeon: Rogene Houston, MD;  [facility];  Service: Endoscopy;;  colon  . PVC ablation  04/14/13   parahisian PVC focus ablated by Dr Rayann Heman  . SUPRAVENTRICULAR TACHYCARDIA ABLATION N/A 04/14/2013   Procedure: VT Ablation;  Surgeon: Thompson Grayer, MD;  Location: [facility] CATH LAB;  Service: Cardiovascular;  Laterality: N/A;    Current Outpatient Medications  Medication Sig Dispense Refill  . aspirin EC 81 MG tablet Take 1 tablet (81 mg total) by mouth daily.    Marland Kitchen dicyclomine (BENTYL) 10 MG capsule Take 1 capsule (10 mg total) by mouth 3 (three) times daily as needed. (Patient not taking: Reported on 04/09/2019) 90 capsule 1  . gabapentin (NEURONTIN) 300 MG capsule Take 1-2 capsules (300-600 mg total) by mouth daily as needed. 90 capsule 3  . gabapentin (NEURONTIN) 800 MG tablet Take 1 tablet (800 mg total) by mouth 2 (two) times daily. 180 tablet 3  . lisinopril (PRINIVIL,ZESTRIL) 20 MG tablet TAKE ONE TABLET BY MOUTH AT BEDTIME 30 tablet 2  . pantoprazole (PROTONIX) 40 MG tablet Take 1 tablet (40  mg total) by mouth daily before supper. 90 tablet 3  . rosuvastatin (CRESTOR) 5 MG tablet Take 5 mg by mouth at bedtime.     . traZODone (DESYREL) 100 MG tablet Take 1 tablet (100 mg total) by mouth at bedtime. 90 tablet 3  . verapamil (CALAN-SR) 240 MG CR tablet TAKE ONE TABLET BY MOUTH DAILY. 90 tablet 0   No current facility-administered medications for this visit.     Allergies:   Flecainide and Multaq [dronedarone]   Social History:  The patient  reports that he has never  smoked. He has never used smokeless tobacco. He reports that he does not drink alcohol or use drugs.   Family History:  The patient's  family history includes Atrial fibrillation in his father; Breast cancer in his sister; CAD in his mother; Colon cancer in his father; Heart disease in his maternal grandfather; Lung cancer in his sister; Rheum arthritis in his mother; Scleroderma in his brother.   ROS:  Please see the history of present illness.   All other systems are personally reviewed and negative.    Exam:    Vital Signs:  There were no vitals taken for this visit.  Well sounding, alert and conversant   Labs/Other Tests and Data Reviewed:    Recent Labs: 04/29/2018: Hemoglobin 14.2; Platelets 198   Wt Readings from Last 3 Encounters:  12/02/18 180 lb (81.6 kg)  08/20/18 188 lb 12.8 oz (85.6 kg)  08/05/18 185 lb 8 oz (84.1 kg)      ASSESSMENT & PLAN:    1.  PVC's Stable on Verapamil No changes today  2.  HTN Stable No change required today  3.  OSA Referred to Dr Maxwell Caul who started CPAP He states "CPAP has worked wonders.  I am sleeping through the night now.  It has helped a bunch".   Follow-up with me in a year   Patient Risk:  after full review of this patients clinical status, I feel that they are at moderate risk at this time.  Today, I have spent 15 minutes with the patient with telehealth technology discussing arrhythmia management .    Army Fossa, MD  04/10/2019 9:29 AM     [facility] [address] [place] [address] [phone] (office) [phone] (fax)

## 2019-04-14 DIAGNOSIS — G4733 Obstructive sleep apnea (adult) (pediatric): Secondary | ICD-10-CM | POA: Diagnosis not present

## 2019-04-23 DIAGNOSIS — Z6831 Body mass index (BMI) 31.0-31.9, adult: Secondary | ICD-10-CM | POA: Diagnosis not present

## 2019-04-23 DIAGNOSIS — M7542 Impingement syndrome of left shoulder: Secondary | ICD-10-CM | POA: Diagnosis not present

## 2019-04-30 DIAGNOSIS — G4733 Obstructive sleep apnea (adult) (pediatric): Secondary | ICD-10-CM | POA: Diagnosis not present

## 2019-05-30 DIAGNOSIS — G4733 Obstructive sleep apnea (adult) (pediatric): Secondary | ICD-10-CM | POA: Diagnosis not present

## 2019-06-30 DIAGNOSIS — G4733 Obstructive sleep apnea (adult) (pediatric): Secondary | ICD-10-CM | POA: Diagnosis not present

## 2019-07-01 ENCOUNTER — Other Ambulatory Visit: Payer: Self-pay | Admitting: Adult Health

## 2019-07-28 DIAGNOSIS — Z6830 Body mass index (BMI) 30.0-30.9, adult: Secondary | ICD-10-CM | POA: Diagnosis not present

## 2019-07-28 DIAGNOSIS — R21 Rash and other nonspecific skin eruption: Secondary | ICD-10-CM | POA: Diagnosis not present

## 2019-07-28 DIAGNOSIS — M7542 Impingement syndrome of left shoulder: Secondary | ICD-10-CM | POA: Diagnosis not present

## 2019-07-28 DIAGNOSIS — M797 Fibromyalgia: Secondary | ICD-10-CM | POA: Diagnosis not present

## 2019-07-28 DIAGNOSIS — M7552 Bursitis of left shoulder: Secondary | ICD-10-CM | POA: Diagnosis not present

## 2019-07-30 DIAGNOSIS — G4733 Obstructive sleep apnea (adult) (pediatric): Secondary | ICD-10-CM | POA: Diagnosis not present

## 2019-08-10 DIAGNOSIS — M7542 Impingement syndrome of left shoulder: Secondary | ICD-10-CM | POA: Diagnosis not present

## 2019-08-10 DIAGNOSIS — M797 Fibromyalgia: Secondary | ICD-10-CM | POA: Diagnosis not present

## 2019-08-10 DIAGNOSIS — M7552 Bursitis of left shoulder: Secondary | ICD-10-CM | POA: Diagnosis not present

## 2019-08-10 DIAGNOSIS — Z6829 Body mass index (BMI) 29.0-29.9, adult: Secondary | ICD-10-CM | POA: Diagnosis not present

## 2019-08-10 DIAGNOSIS — R21 Rash and other nonspecific skin eruption: Secondary | ICD-10-CM | POA: Diagnosis not present

## 2019-08-12 DIAGNOSIS — M7592 Shoulder lesion, unspecified, left shoulder: Secondary | ICD-10-CM | POA: Diagnosis not present

## 2019-08-12 DIAGNOSIS — M25512 Pain in left shoulder: Secondary | ICD-10-CM | POA: Diagnosis not present

## 2019-08-20 DIAGNOSIS — M25512 Pain in left shoulder: Secondary | ICD-10-CM | POA: Diagnosis not present

## 2019-08-25 DIAGNOSIS — M25512 Pain in left shoulder: Secondary | ICD-10-CM | POA: Diagnosis not present

## 2019-08-25 NOTE — Progress Notes (Signed)
PATIENT: Anthony Sherman DOB: 1960/09/26  REASON FOR VISIT: follow up HISTORY FROM: patient  Chief Complaint  Patient presents with  . Follow-up    RM 1, alone. Last seen 08/20/2018. In more pain since last visit. Has arthritis/bursitis in left shoulder. Also has torn labia, do PT for this.      HISTORY OF PRESENT ILLNESS: Today 08/26/19 Anthony Sherman is a 59 y.o. male here today for follow up for fibromyalgia. He continues gabapentin 800mg  twice daily and 300-600mg  at lunch. He was weaned of Cymbalta last year. Over the past year, pain has increased. He reports constant pain, all over. He has tried to increase activity. Pain worsens when he has been active the day before. He sleeps well most nights. Trazodone 100mg  helps. He uses CPAP nightly. He is followed by PCP for osteoarthritis of shoulder. He is participating in PT and feels that it is helping with strengthening and ROM.    HISTORY: (copied from [place] note on 08/20/2018)  Ms. Perelli is a 59 year old male with a history of fibromyalgia.  He returns today for follow-up.  He is currently taking 800 mg of gabapentin twice a day and taking 3 to 600 mg at lunch if needed.  At the last visit we increase Cymbalta 30 mg twice a day.  He reports that he has not really noticed any change since adding the Cymbalta.  He does also note that he has a rash on his stomach and in his legs that pops up intermittently.  He states that is not painful but it does itch.  He is unsure if this is related to the additional dose of Cymbalta.  He continues to state most of his pain is in his hands.  Particularly around the thumb.  He also states that at night he has pain in the hips.  He reports that his sleep has improved since he started on CPAP.  He continues to take trazodone at bedtime.   HISTORY 02/27/18:  Mr. Alford Highland is a 59 year old male with a history of fibromyalgia. He returns today for follow-up. He states that the extra  gabapentin has been beneficial. He reports that he typically takes it at lunch. There are times that he takes 600 mg instead of 300 and this tends to work well for him. He states on a daily basis he has constant pain in his hands shoulders and legs. He rates his pain a 5 out of 10. He states at least twice a week his pain will increase to 8 out of 10. He tries to maintain some physical activity. He states that he still is not sleeping well at night despite being on trazodone. He returns today for evaluation.   REVIEW OF SYSTEMS: Out of a complete 14 system review of symptoms, the patient complains only of the following symptoms, chronic pain and all other reviewed systems are negative.  ALLERGIES: Allergies  Allergen Reactions  . Flecainide Other (See Comments)    SOB 2014  . Multaq [Dronedarone] Other (See Comments)    Nightmares    HOME MEDICATIONS: Outpatient Medications Prior to Visit  Medication Sig Dispense Refill  . aspirin EC 81 MG tablet Take 1 tablet (81 mg total) by mouth daily.    Marland Kitchen gabapentin (NEURONTIN) 300 MG capsule Take 1-2 capsules (300-600 mg total) by mouth daily as needed. 90 capsule 3  . gabapentin (NEURONTIN) 800 MG tablet Take 1 tablet (800 mg total) by mouth 2 (two) times daily. [place]  tablet 3  . lisinopril (PRINIVIL,ZESTRIL) 20 MG tablet TAKE ONE TABLET BY MOUTH AT BEDTIME 30 tablet 2  . pantoprazole (PROTONIX) 40 MG tablet Take 1 tablet (40 mg total) by mouth daily before supper. 90 tablet 3  . rosuvastatin (CRESTOR) 5 MG tablet Take 5 mg by mouth at bedtime.     . traZODone (DESYREL) 100 MG tablet TAKE 1 TABLET AT BEDTIME 90 tablet 3  . verapamil (CALAN-SR) 240 MG CR tablet TAKE ONE TABLET BY MOUTH DAILY. 90 tablet 0  . dicyclomine (BENTYL) 10 MG capsule Take 1 capsule (10 mg total) by mouth 3 (three) times daily as needed. (Patient not taking: Reported on 04/09/2019) 90 capsule 1   No facility-administered medications prior to visit.    PAST MEDICAL  HISTORY: Past Medical History:  Diagnosis Date  . Chronic insomnia 08/30/2017   improved with CPAP  . Essential hypertension, benign   . Fibromyalgia   . GERD (gastroesophageal reflux disease)   . Mixed hyperlipidemia   . Obstructive sleep apnea    uses CPAP,  followed by Dr Maxwell Caul  . Premature ventricular contractions   . Syncope    2010    PAST SURGICAL HISTORY: Past Surgical History:  Procedure Laterality Date  . APPENDECTOMY  ~ 2000  . BIOPSY  03/12/2018   Procedure: BIOPSY;  Surgeon: Rogene Houston, MD;  [facility];  Service: Endoscopy;;  gastric esophageal  . CHOLECYSTECTOMY  ~ 2000  . COLONOSCOPY N/A 03/12/2018   Procedure: COLONOSCOPY;  Surgeon: Rogene Houston, MD;  [facility];  Service: Endoscopy;  Laterality: N/A;  . ESOPHAGOGASTRODUODENOSCOPY N/A 03/12/2018   Procedure: ESOPHAGOGASTRODUODENOSCOPY (EGD);  Surgeon: Rogene Houston, MD;  [facility];  Service: Endoscopy;  Laterality: N/A;  7:30  . LEFT HEART CATHETERIZATION WITH CORONARY ANGIOGRAM N/A 01/07/2013   Procedure: LEFT HEART CATHETERIZATION WITH CORONARY ANGIOGRAM;  Surgeon: Minus Breeding, MD;  Location: [facility] CATH LAB;  Service: Cardiovascular;  Laterality: N/A;  . POLYPECTOMY  03/12/2018   Procedure: POLYPECTOMY;  Surgeon: Rogene Houston, MD;  [facility];  Service: Endoscopy;;  colon  . PVC ablation  04/14/13   parahisian PVC focus ablated by Dr Rayann Heman  . SUPRAVENTRICULAR TACHYCARDIA ABLATION N/A 04/14/2013   Procedure: VT Ablation;  Surgeon: Thompson Grayer, MD;  Location: [facility] CATH LAB;  Service: Cardiovascular;  Laterality: N/A;    FAMILY HISTORY: Family History  Problem Relation Age of Onset  . CAD Mother   . Rheum arthritis Mother   . Colon cancer Father   . Atrial fibrillation Father   . Breast cancer Sister   . Lung cancer Sister   . Heart disease Maternal Grandfather   . Scleroderma Brother     SOCIAL HISTORY: Social History   Socioeconomic  History  . Marital status: Married    Spouse name: Not on file  . Number of children: 2  . Years of education: Some college  . Highest education level: Not on file  Occupational History  . Occupation: N/A  Tobacco Use  . Smoking status: Never Smoker  . Smokeless tobacco: Never Used  Substance and Sexual Activity  . Alcohol use: No  . Drug use: No  . Sexual activity: Yes    Partners: Female    Comment: Married  Other Topics Concern  . Not on file  Social History Narrative   Lives at home w/ his wife   Right-handed   Caffeine: 4 drinks per day  Social Determinants of Health   Financial Resource Strain:   . Difficulty of Paying Living Expenses: Not on file  Food Insecurity:   . Worried About Charity fundraiser in the Last Year: Not on file  . Ran Out of Food in the Last Year: Not on file  Transportation Needs:   . Lack of Transportation (Medical): Not on file  . Lack of Transportation (Non-Medical): Not on file  Physical Activity:   . Days of Exercise per Week: Not on file  . Minutes of Exercise per Session: Not on file  Stress:   . Feeling of Stress : Not on file  Social Connections:   . Frequency of Communication with Friends and Family: Not on file  . Frequency of Social Gatherings with Friends and Family: Not on file  . Attends Religious Services: Not on file  . Active Member of Clubs or Organizations: Not on file  . Attends Archivist Meetings: Not on file  . Marital Status: Not on file  Intimate Partner Violence:   . Fear of Current or Ex-Partner: Not on file  . Emotionally Abused: Not on file  . Physically Abused: Not on file  . Sexually Abused: Not on file      PHYSICAL EXAM  Vitals:   08/26/19 0714  BP: 121/81  Pulse: 77  Temp: 97.6 F (36.4 C)  Weight: 186 lb 3.2 oz (84.5 kg)  Height: 5\' 8"  (1.727 m)   Body mass index is 28.31 kg/m.  Generalized: Well developed, in no acute distress  Cardiology: normal rate and rhythm, no murmur  noted Respiratory: clear to auscultation bilaterally  Neurological examination  Mentation: Alert oriented to time, place, history taking. Follows all commands speech and language fluent Cranial nerve II-XII: Pupils were equal round reactive to light. Extraocular movements were full, visual field were full on confrontational test. Facial sensation and strength were normal. Head turning and shoulder shrug  were normal and symmetric. Motor: The motor testing reveals 5 over 5 strength of all 4 extremities. Good symmetric motor tone is noted throughout.  Sensory: Sensory testing is intact to soft touch on all 4 extremities. No evidence of extinction is noted.  Coordination: Cerebellar testing reveals good finger-nose-finger and heel-to-shin bilaterally.  Gait and station: Gait is normal.  Reflexes: Deep tendon reflexes are symmetric and normal bilaterally.   DIAGNOSTIC DATA (LABS, IMAGING, TESTING) - I reviewed patient records, labs, notes, testing and imaging myself where available.  No flowsheet data found.   Lab Results  Component Value Date   WBC 4.6 04/29/2018   HGB 14.2 04/29/2018   HCT 42.7 04/29/2018   MCV 86.1 04/29/2018   PLT 198 04/29/2018      Component Value Date/Time   NA 137 07/03/2013 1040   K 4.8 07/03/2013 1040   CL 103 07/03/2013 1040   CO2 26 07/03/2013 1040   GLUCOSE 93 07/03/2013 1040   BUN 15 07/03/2013 1040   CREATININE 1.10 03/26/2018 1213   CALCIUM 10.0 07/03/2013 1040   No results found for: CHOL, HDL, LDLCALC, LDLDIRECT, TRIG, CHOLHDL No results found for: HGBA1C Lab Results  Component Value Date   VITAMINB12 313 04/29/2018   No results found for: TSH     ASSESSMENT AND PLAN 59 y.o. year old male  has a past medical history of Chronic insomnia (08/30/2017), Essential hypertension, benign, Fibromyalgia, GERD (gastroesophageal reflux disease), Mixed hyperlipidemia, Obstructive sleep apnea, Premature ventricular contractions, and Syncope. here with      ICD-10-CM  1. Fibromyalgia  M79.7     [place] reports that he has had gradual worsening of fibromyalgia pain over the past year.  He continues gabapentin 800 mg twice daily but has been adjusting lunch dose of 3 to 600 mg.  He does feel that pain is improved when he takes 600 mg at lunch.  I suggested that he adjust scheduled to 800 mg in the morning, 600 mg at lunch and 800 mg in the evening.  He did not respond well to duloxetine.  We have discussed benefit of SSRI versus anti-inflammatory agent.  He denies any history of kidney disease.  Most recent creatinine was normal in August 2020.  We will request updated labs today from PCP.  I feel that he may respond well to meloxicam 7.5 mg daily for 6 to 8 weeks and then as needed awaiting.  We have discussed potential side effects of this medication.  He will also continue physical therapy as directed by primary care for shoulder pain.  Adequate hydration and regular exercise will help.  We have discussed consideration of a low glycemic diet.  He will follow-up in 1 year, sooner if needed.  He verbalizes understanding and agreement with this plan.   No orders of the defined types were placed in this encounter.    No orders of the defined types were placed in this encounter.     I spent 15 minutes with the patient. 50% of this time was spent counseling and educating patient on plan of care and medications.    Debbora Presto, FNP-C 08/26/2019, 7:35 AM [facility] [address] [place], [place] [address] [phone]

## 2019-08-26 ENCOUNTER — Ambulatory Visit: Payer: Medicare HMO | Admitting: Family Medicine

## 2019-08-26 ENCOUNTER — Other Ambulatory Visit: Payer: Self-pay

## 2019-08-26 ENCOUNTER — Ambulatory Visit: Payer: Medicare HMO | Admitting: Adult Health

## 2019-08-26 ENCOUNTER — Telehealth: Payer: Self-pay | Admitting: Family Medicine

## 2019-08-26 ENCOUNTER — Encounter: Payer: Self-pay | Admitting: Family Medicine

## 2019-08-26 VITALS — BP 121/81 | HR 77 | Temp 97.6°F | Ht 68.0 in | Wt 186.2 lb

## 2019-08-26 DIAGNOSIS — M797 Fibromyalgia: Secondary | ICD-10-CM | POA: Diagnosis not present

## 2019-08-26 MED ORDER — MELOXICAM 7.5 MG PO TABS: 7.5000 mg | ORAL_TABLET | Freq: Every day | ORAL | 11 refills | Status: DC

## 2019-08-26 NOTE — Patient Instructions (Addendum)
We will continue gabapentin 800mg  twice daily. Please increase lunch dose to 600mg  every day. We will consider adding SSRI or antiinflammatory agent pending response.   Continue PT  Consider low glycemic diet.   Follow up in 1 year, sooner if needed   Myofascial Pain Syndrome and Fibromyalgia Myofascial pain syndrome and fibromyalgia are both pain disorders. This pain may be felt mainly in your muscles.  Myofascial pain syndrome: ? Always has tender points in the muscle that will cause pain when pressed (trigger points). The pain may come and go. ? Usually affects your neck, upper back, and shoulder areas. The pain often radiates into your arms and hands.  Fibromyalgia: ? Has muscle pains and tenderness that come and go. ? Is often associated with fatigue and sleep problems. ? Has trigger points. ? Tends to be long-lasting (chronic), but is not life-threatening. Fibromyalgia and myofascial pain syndrome are not the same. However, they often occur together. If you have both conditions, each can make the other worse. Both are common and can cause enough pain and fatigue to make day-to-day activities difficult. Both can be hard to diagnose because their symptoms are common in many other conditions. What are the causes? The exact causes of these conditions are not known. What increases the risk? You are more likely to develop this condition if:  You have a family history of the condition.  You have certain triggers, such as: ? Spine disorders. ? An injury (trauma) or other physical stressors. ? Being under a lot of stress. ? Medical conditions such as osteoarthritis, rheumatoid arthritis, or lupus. What are the signs or symptoms? Fibromyalgia The main symptom of fibromyalgia is widespread pain and tenderness in your muscles. Pain is sometimes described as stabbing, shooting, or burning. You may also have:  Tingling or numbness.  Sleep problems and fatigue.  Problems with  attention and concentration (fibro fog). Other symptoms may include:  Bowel and bladder problems.  Headaches.  Visual problems.  Problems with odors and noises.  Depression or mood changes.  Painful menstrual periods (dysmenorrhea).  Dry skin or eyes. These symptoms can vary over time. Myofascial pain syndrome Symptoms of myofascial pain syndrome include:  Tight, ropy bands of muscle.  Uncomfortable sensations in muscle areas. These may include aching, cramping, burning, numbness, tingling, and weakness.  Difficulty moving certain parts of the body freely (poor range of motion). How is this diagnosed? This condition may be diagnosed by your symptoms and medical history. You will also have a physical exam. In general:  Fibromyalgia is diagnosed if you have pain, fatigue, and other symptoms for more than 3 months, and symptoms cannot be explained by another condition.  Myofascial pain syndrome is diagnosed if you have trigger points in your muscles, and those trigger points are tender and cause pain elsewhere in your body (referred pain). How is this treated? Treatment for these conditions depends on the type that you have.  For fibromyalgia: ? Pain medicines, such as NSAIDs. ? Medicines for treating depression. ? Medicines for treating seizures. ? Medicines that relax the muscles.  For myofascial pain: ? Pain medicines, such as NSAIDs. ? Cooling and stretching of muscles. ? Trigger point injections. ? Sound wave (ultrasound) treatments to stimulate muscles. Treating these conditions often requires a team of health care providers. These may include:  Your primary care provider.  Physical therapist.  Complementary health care providers, such as massage therapists or acupuncturists.  Psychiatrist for cognitive behavioral therapy. Follow these instructions at home:  Medicines  Take over-the-counter and prescription medicines only as told by your health care  provider.  Do not drive or use heavy machinery while taking prescription pain medicine.  If you are taking prescription pain medicine, take actions to prevent or treat constipation. Your health care provider may recommend that you: ? Drink enough fluid to keep your urine pale yellow. ? Eat foods that are high in fiber, such as fresh fruits and vegetables, whole grains, and beans. ? Limit foods that are high in fat and processed sugars, such as fried or sweet foods. ? Take an over-the-counter or prescription medicine for constipation. Lifestyle   Exercise as directed by your health care provider or physical therapist.  Practice relaxation techniques to control your stress. You may want to try: ? Biofeedback. ? Visual imagery. ? Hypnosis. ? Muscle relaxation. ? Yoga. ? Meditation.  Maintain a healthy lifestyle. This includes eating a healthy diet and getting enough sleep.  Do not use any products that contain nicotine or tobacco, such as cigarettes and e-cigarettes. If you need help quitting, ask your health care provider. General instructions  Talk to your health care provider about complementary treatments, such as acupuncture or massage.  Consider joining a support group with others who are diagnosed with this condition.  Do not do activities that stress or strain your muscles. This includes repetitive motions and heavy lifting.  Keep all follow-up visits as told by your health care provider. This is important. Where to find more information  National Fibromyalgia Association: www.fmaware.[place]: www.arthritis.org  American Chronic Pain Association: www.theacpa.org Contact a health care provider if:  You have new symptoms.  Your symptoms get worse or your pain is severe.  You have side effects from your medicines.  You have trouble sleeping.  Your condition is causing depression or anxiety. Summary  Myofascial pain syndrome and fibromyalgia  are pain disorders.  Myofascial pain syndrome has tender points in the muscle that will cause pain when pressed (trigger points). Fibromyalgia also has muscle pains and tenderness that come and go, but this condition is often associated with fatigue and sleep disturbances.  Fibromyalgia and myofascial pain syndrome are not the same but often occur together, causing pain and fatigue that make day-to-day activities difficult.  Treatment for fibromyalgia includes taking medicines to relax the muscles and medicines for pain, depression, or seizures. Treatment for myofascial pain syndrome includes taking medicines for pain, cooling and stretching of muscles, and injecting medicines into trigger points.  Follow your health care provider's instructions for taking medicines and maintaining a healthy lifestyle. This information is not intended to replace advice given to you by your health care provider. Make sure you discuss any questions you have with your health care provider. Document Revised: 11/14/2018 Document Reviewed: 08/07/2017 Elsevier Patient Education  2020 [employer].

## 2019-08-26 NOTE — Progress Notes (Signed)
I have read the note, and I agree with the clinical assessment and plan.  Elfriede Bonini K Klohe Lover[place]   

## 2019-08-26 NOTE — Telephone Encounter (Signed)
Please let him know that I have called in Meloxicam 7.5mg  daily. I would like for him to take this every day for 4-6 weeks, then decrease to as needed for pain. Long term use can be associated with increased risk for GI distress, cardiovascular disease, kidney disease and bleeding. I have discussed medication with him in the office with potential side effects of GI distress, heartburn, stomach pain, etc. Remind him to take with food.

## 2019-08-27 DIAGNOSIS — M25512 Pain in left shoulder: Secondary | ICD-10-CM | POA: Diagnosis not present

## 2019-08-27 NOTE — Telephone Encounter (Signed)
I called pt and relayed the message about taking meloxicam 7.5mg  po daily for 4-6 wks then prn.  SE dicussed.  He verbalized understanding and will call if back if needed.

## 2019-09-01 DIAGNOSIS — M25512 Pain in left shoulder: Secondary | ICD-10-CM | POA: Diagnosis not present

## 2019-09-03 DIAGNOSIS — M25512 Pain in left shoulder: Secondary | ICD-10-CM | POA: Diagnosis not present

## 2019-09-08 DIAGNOSIS — M25512 Pain in left shoulder: Secondary | ICD-10-CM | POA: Diagnosis not present

## 2019-09-10 DIAGNOSIS — M25512 Pain in left shoulder: Secondary | ICD-10-CM | POA: Diagnosis not present

## 2019-09-15 DIAGNOSIS — M25512 Pain in left shoulder: Secondary | ICD-10-CM | POA: Diagnosis not present

## 2019-09-17 DIAGNOSIS — M25512 Pain in left shoulder: Secondary | ICD-10-CM | POA: Diagnosis not present

## 2019-09-28 ENCOUNTER — Other Ambulatory Visit: Payer: Self-pay | Admitting: Adult Health

## 2019-09-28 ENCOUNTER — Other Ambulatory Visit: Payer: Self-pay | Admitting: Internal Medicine

## 2019-09-28 ENCOUNTER — Other Ambulatory Visit: Payer: Self-pay | Admitting: Neurology

## 2019-10-19 DIAGNOSIS — G4733 Obstructive sleep apnea (adult) (pediatric): Secondary | ICD-10-CM | POA: Diagnosis not present

## 2019-10-22 DIAGNOSIS — G4733 Obstructive sleep apnea (adult) (pediatric): Secondary | ICD-10-CM | POA: Diagnosis not present

## 2019-12-10 DIAGNOSIS — S93402A Sprain of unspecified ligament of left ankle, initial encounter: Secondary | ICD-10-CM | POA: Diagnosis not present

## 2020-01-05 DIAGNOSIS — N400 Enlarged prostate without lower urinary tract symptoms: Secondary | ICD-10-CM | POA: Diagnosis not present

## 2020-01-05 DIAGNOSIS — K219 Gastro-esophageal reflux disease without esophagitis: Secondary | ICD-10-CM | POA: Diagnosis not present

## 2020-01-05 DIAGNOSIS — I10 Essential (primary) hypertension: Secondary | ICD-10-CM | POA: Diagnosis not present

## 2020-01-05 DIAGNOSIS — E7800 Pure hypercholesterolemia, unspecified: Secondary | ICD-10-CM | POA: Diagnosis not present

## 2020-01-05 DIAGNOSIS — E782 Mixed hyperlipidemia: Secondary | ICD-10-CM | POA: Diagnosis not present

## 2020-01-05 DIAGNOSIS — R5383 Other fatigue: Secondary | ICD-10-CM | POA: Diagnosis not present

## 2020-01-05 DIAGNOSIS — Z0001 Encounter for general adult medical examination with abnormal findings: Secondary | ICD-10-CM | POA: Diagnosis not present

## 2020-01-07 DIAGNOSIS — I493 Ventricular premature depolarization: Secondary | ICD-10-CM | POA: Diagnosis not present

## 2020-01-07 DIAGNOSIS — Z0001 Encounter for general adult medical examination with abnormal findings: Secondary | ICD-10-CM | POA: Diagnosis not present

## 2020-01-07 DIAGNOSIS — M7542 Impingement syndrome of left shoulder: Secondary | ICD-10-CM | POA: Diagnosis not present

## 2020-01-07 DIAGNOSIS — Z6828 Body mass index (BMI) 28.0-28.9, adult: Secondary | ICD-10-CM | POA: Diagnosis not present

## 2020-01-07 DIAGNOSIS — Z23 Encounter for immunization: Secondary | ICD-10-CM | POA: Diagnosis not present

## 2020-01-07 DIAGNOSIS — I498 Other specified cardiac arrhythmias: Secondary | ICD-10-CM | POA: Diagnosis not present

## 2020-01-07 DIAGNOSIS — R911 Solitary pulmonary nodule: Secondary | ICD-10-CM | POA: Diagnosis not present

## 2020-01-07 DIAGNOSIS — D126 Benign neoplasm of colon, unspecified: Secondary | ICD-10-CM | POA: Diagnosis not present

## 2020-02-08 ENCOUNTER — Other Ambulatory Visit (INDEPENDENT_AMBULATORY_CARE_PROVIDER_SITE_OTHER): Payer: Self-pay | Admitting: Internal Medicine

## 2020-03-24 DIAGNOSIS — Z6828 Body mass index (BMI) 28.0-28.9, adult: Secondary | ICD-10-CM | POA: Diagnosis not present

## 2020-03-24 DIAGNOSIS — S76012A Strain of muscle, fascia and tendon of left hip, initial encounter: Secondary | ICD-10-CM | POA: Diagnosis not present

## 2020-03-24 DIAGNOSIS — M797 Fibromyalgia: Secondary | ICD-10-CM | POA: Diagnosis not present

## 2020-03-28 ENCOUNTER — Other Ambulatory Visit: Payer: Self-pay | Admitting: Internal Medicine

## 2020-05-18 DIAGNOSIS — Z6827 Body mass index (BMI) 27.0-27.9, adult: Secondary | ICD-10-CM | POA: Diagnosis not present

## 2020-05-18 DIAGNOSIS — S76012A Strain of muscle, fascia and tendon of left hip, initial encounter: Secondary | ICD-10-CM | POA: Diagnosis not present

## 2020-05-25 DIAGNOSIS — S76012A Strain of muscle, fascia and tendon of left hip, initial encounter: Secondary | ICD-10-CM | POA: Diagnosis not present

## 2020-07-04 ENCOUNTER — Other Ambulatory Visit: Payer: Self-pay | Admitting: Internal Medicine

## 2020-07-31 ENCOUNTER — Other Ambulatory Visit: Payer: Self-pay | Admitting: Adult Health

## 2020-08-01 ENCOUNTER — Other Ambulatory Visit: Payer: Self-pay

## 2020-08-01 ENCOUNTER — Telehealth: Payer: Self-pay | Admitting: Internal Medicine

## 2020-08-01 MED ORDER — VERAPAMIL HCL ER 240 MG PO TBCR: 240.0000 mg | EXTENDED_RELEASE_TABLET | Freq: Every day | ORAL | 0 refills | Status: DC

## 2020-08-01 NOTE — Telephone Encounter (Signed)
    1. Which medications need to be refilled? (please list name of each medication and dose if known) verapamil 240 mg  2. Which pharmacy/location (including street and city if local pharmacy) is medication to be sent to?  [facility]  3. Do they need a 30 day or 90 day supply?

## 2020-08-01 NOTE — Telephone Encounter (Signed)
Refilled Verapamil 240 mg.

## 2020-08-12 ENCOUNTER — Encounter: Payer: Self-pay | Admitting: Internal Medicine

## 2020-08-12 ENCOUNTER — Ambulatory Visit (INDEPENDENT_AMBULATORY_CARE_PROVIDER_SITE_OTHER): Payer: Medicare HMO | Admitting: Internal Medicine

## 2020-08-12 ENCOUNTER — Encounter: Payer: Self-pay | Admitting: *Deleted

## 2020-08-12 VITALS — BP 120/72 | HR 79 | Resp 16 | Ht 67.0 in | Wt 176.6 lb

## 2020-08-12 DIAGNOSIS — G4733 Obstructive sleep apnea (adult) (pediatric): Secondary | ICD-10-CM

## 2020-08-12 DIAGNOSIS — I493 Ventricular premature depolarization: Secondary | ICD-10-CM | POA: Diagnosis not present

## 2020-08-12 DIAGNOSIS — R079 Chest pain, unspecified: Secondary | ICD-10-CM | POA: Diagnosis not present

## 2020-08-12 DIAGNOSIS — I10 Essential (primary) hypertension: Secondary | ICD-10-CM | POA: Diagnosis not present

## 2020-08-12 DIAGNOSIS — R0602 Shortness of breath: Secondary | ICD-10-CM | POA: Diagnosis not present

## 2020-08-12 MED ORDER — VERAPAMIL HCL ER 240 MG PO TBCR: 240.0000 mg | EXTENDED_RELEASE_TABLET | Freq: Every day | ORAL | 3 refills | Status: DC

## 2020-08-12 NOTE — Patient Instructions (Signed)
Medication Instructions:   Your physician recommends that you continue on your current medications as directed. Please refer to the Current Medication list given to you today.  Labwork:  None  Testing/Procedures: Your physician has requested that you have a lexiscan myoview. For further information please visit [email or web address]. Please follow instruction sheet, as given.  Follow-Up:  Your physician recommends that you schedule a follow-up appointment in: 1 year.  Any Other Special Instructions Will Be Listed Below (If Applicable).  If you need a refill on your cardiac medications before your next appointment, please call your pharmacy.

## 2020-08-12 NOTE — Progress Notes (Signed)
PCP: Curlene Labrum, MD   Primary EP: Dr Rayann Heman  Anthony Sherman is a 60 y.o. male who presents today for routine electrophysiology followup.  Since last being seen in our clinic, the patient reports doing very well.  He had chest discomfort after thanksgiving for several days.  Thinks this was indigestion.  Denies exertional symptoms.  Stable SOB.   Today, he denies symptoms of palpitations,  lower extremity edema, dizziness, presyncope, or syncope. His primary concern is with chronic pain/ fibromyalgia.  The patient is otherwise without complaint today. He and his wife have separated. He is working on old cars.  Past Medical History:  Diagnosis Date  . Chronic insomnia 08/30/2017   improved with CPAP  . Essential hypertension, benign   . Fibromyalgia   . GERD (gastroesophageal reflux disease)   . Mixed hyperlipidemia   . Obstructive sleep apnea    uses CPAP,  followed by Dr Maxwell Caul  . Premature ventricular contractions   . Syncope    2010   Past Surgical History:  Procedure Laterality Date  . APPENDECTOMY  ~ 2000  . BIOPSY  03/12/2018   Procedure: BIOPSY;  Surgeon: Rogene Houston, MD;  Location: [facility];  Service: Endoscopy;;  gastric esophageal  . CHOLECYSTECTOMY  ~ 2000  . COLONOSCOPY N/A 03/12/2018   Procedure: COLONOSCOPY;  Surgeon: Rogene Houston, MD;  Location: [facility];  Service: Endoscopy;  Laterality: N/A;  . ESOPHAGOGASTRODUODENOSCOPY N/A 03/12/2018   Procedure: ESOPHAGOGASTRODUODENOSCOPY (EGD);  Surgeon: Rogene Houston, MD;  Location: [facility];  Service: Endoscopy;  Laterality: N/A;  7:30  . LEFT HEART CATHETERIZATION WITH CORONARY ANGIOGRAM N/A 01/07/2013   Procedure: LEFT HEART CATHETERIZATION WITH CORONARY ANGIOGRAM;  Surgeon: Minus Breeding, MD;  Location: [facility] CATH LAB;  Service: Cardiovascular;  Laterality: N/A;  . POLYPECTOMY  03/12/2018   Procedure: POLYPECTOMY;  Surgeon: Rogene Houston, MD;  Location: [facility];  Service: Endoscopy;;   colon  . PVC ablation  04/14/13   parahisian PVC focus ablated by Dr Rayann Heman  . SUPRAVENTRICULAR TACHYCARDIA ABLATION N/A 04/14/2013   Procedure: VT Ablation;  Surgeon: Thompson Grayer, MD;  Location: [facility] C[facility] LAB;  Service: Cardiovascular;  Laterality: N/A;    ROS- all systems are reviewed and negatives except as per HPI above  Current Outpatient Medications  Medication Sig Dispense Refill  . aspirin EC 81 MG tablet Take 1 tablet (81 mg total) by mouth daily.    Marland Kitchen gabapentin (NEURONTIN) 300 MG capsule Take 300 mg by mouth 2 (two) times daily as needed (pain).    Marland Kitchen gabapentin (NEURONTIN) 800 MG tablet TAKE 1 TABLET TWICE DAILY 180 tablet 3  . lisinopril (PRINIVIL,ZESTRIL) 20 MG tablet TAKE ONE TABLET BY MOUTH AT BEDTIME 30 tablet 2  . pantoprazole (PROTONIX) 40 MG tablet TAKE 1 TABLET (40 MG TOTAL) BY MOUTH DAILY BEFORE SUPPER. 90 tablet 0  . rosuvastatin (CRESTOR) 5 MG tablet Take 5 mg by mouth at bedtime.     . traZODone (DESYREL) 100 MG tablet TAKE 1 TABLET AT BEDTIME 90 tablet 0  . verapamil (CALAN-SR) 240 MG CR tablet Take 1 tablet (240 mg total) by mouth daily. 30 tablet 0   No current facility-administered medications for this visit.    Physical Exam: Vitals:   08/12/20 1000  BP: 120/72  Pulse: 79  Resp: 16  SpO2: 99%  Weight: 176 lb 9.6 oz (80.1 kg)  Height: 5\' 7"  (1.702 m)    GEN- The patient  is well appearing, alert and oriented x 3 today.   Head- normocephalic, atraumatic Eyes-  Sclera clear, conjunctiva pink Ears- hearing intact Oropharynx- clear Lungs-   normal work of breathing Heart- Regular rate and rhythm  GI- sof  Extremities- no clubbing, cyanosis, or edema  Wt Readings from Last 3 Encounters:  08/12/20 176 lb 9.6 oz (80.1 kg)  08/26/19 186 lb 3.2 oz (84.5 kg)  04/10/19 186 lb (84.4 kg)    EKG tracing ordered today is personally reviewed and shows sinus, no ischemic changes, no pvcs  Assessment and Plan:  1. PVCs Well controlled on verapamil  2.  HTN Stable No change required today  3. OSA Uses CPAP Follows with Dr Maxwell Caul  4. Chest pain/ SOB Both typical and atypical features Last stress test from 2014 reviewed Will proceed with lexiscan myoview to exclude ischemia as the cause  Risks, benefits and potential toxicities for medications prescribed and/or refilled reviewed with patient today.   Return in a year  Thompson Grayer MD, [facility] 08/12/2020 10:07 AM

## 2020-08-15 ENCOUNTER — Telehealth: Payer: Self-pay | Admitting: Internal Medicine

## 2020-08-15 NOTE — Telephone Encounter (Signed)
Pre-cert Verification for the following procedure    LEXISCAN MYOVIEW  DATE: 08/17/2020  LOCATION: [facility]

## 2020-08-17 ENCOUNTER — Encounter (HOSPITAL_COMMUNITY)
Admission: RE | Admit: 2020-08-17 | Discharge: 2020-08-17 | Disposition: A | Discharge status: Home / Self Care | Payer: Medicare HMO | Source: Ambulatory Visit | Attending: Internal Medicine | Admitting: Internal Medicine

## 2020-08-17 ENCOUNTER — Encounter (HOSPITAL_BASED_OUTPATIENT_CLINIC_OR_DEPARTMENT_OTHER)
Admission: RE | Admit: 2020-08-17 | Discharge: 2020-08-17 | Disposition: A | Discharge status: Home / Self Care | Payer: Medicare HMO | Source: Ambulatory Visit | Attending: Internal Medicine | Admitting: Internal Medicine

## 2020-08-17 ENCOUNTER — Other Ambulatory Visit: Payer: Self-pay

## 2020-08-17 DIAGNOSIS — R079 Chest pain, unspecified: Secondary | ICD-10-CM

## 2020-08-17 DIAGNOSIS — R0602 Shortness of breath: Secondary | ICD-10-CM | POA: Diagnosis not present

## 2020-08-17 LAB — NM MYOCAR MULTI W/SPECT W/WALL MOTION / EF
LV dias vol: 94 mL (ref 62–150)
LV sys vol: 38 mL
Peak HR: 99 {beats}/min
RATE: 0.29
Rest HR: 78 {beats}/min
SDS: 1
SRS: 2
SSS: 3
TID: 1.17

## 2020-08-17 MED ORDER — TECHNETIUM TC 99M TETROFOSMIN IV KIT
30.0000 | PACK | Freq: Once | INTRAVENOUS | Status: AC | PRN
  Administered 2020-08-17: 31.4 via INTRAVENOUS

## 2020-08-17 MED ORDER — TECHNETIUM TC 99M TETROFOSMIN IV KIT
10.0000 | PACK | Freq: Once | INTRAVENOUS | Status: AC | PRN
  Administered 2020-08-17: 10.6 via INTRAVENOUS

## 2020-08-17 MED ORDER — REGADENOSON 0.4 MG/5ML IV SOLN
INTRAVENOUS | Status: AC
  Administered 2020-08-17: 0.4 mg via INTRAVENOUS
  Filled 2020-08-17: qty 5

## 2020-08-17 MED ORDER — SODIUM CHLORIDE FLUSH 0.9 % IV SOLN
INTRAVENOUS | Status: AC
  Administered 2020-08-17: 10 mL via INTRAVENOUS
  Filled 2020-08-17: qty 10

## 2020-08-22 ENCOUNTER — Telehealth: Payer: Self-pay | Admitting: *Deleted

## 2020-08-22 NOTE — Telephone Encounter (Signed)
Pt aware.

## 2020-08-22 NOTE — Telephone Encounter (Signed)
-----   Message from Thompson Grayer, MD sent at 08/18/2020  9:55 PM EST ----- Results reviewed.  Alma Friendly, please inform pt of result.

## 2020-08-29 ENCOUNTER — Other Ambulatory Visit: Payer: Self-pay

## 2020-08-29 ENCOUNTER — Ambulatory Visit: Payer: Medicare HMO | Admitting: Family Medicine

## 2020-08-29 ENCOUNTER — Encounter: Payer: Self-pay | Admitting: Family Medicine

## 2020-08-29 VITALS — BP 113/70 | HR 81 | Ht 67.0 in | Wt 178.8 lb

## 2020-08-29 DIAGNOSIS — M797 Fibromyalgia: Secondary | ICD-10-CM | POA: Diagnosis not present

## 2020-08-29 MED ORDER — TRAZODONE HCL 100 MG PO TABS: 100.0000 mg | ORAL_TABLET | Freq: Every day | ORAL | 3 refills | Status: DC

## 2020-08-29 MED ORDER — GABAPENTIN 100 MG PO CAPS: ORAL_CAPSULE | ORAL | 11 refills | Status: AC

## 2020-08-29 NOTE — Progress Notes (Signed)
PATIENT: Anthony Sherman DOB: Apr 09, 1961  REASON FOR VISIT: follow up HISTORY FROM: patient  Chief Complaint  Patient presents with  . Follow-up    RM 2 alone PT is doing good     HISTORY OF PRESENT ILLNESS: Today 08/29/20  He returns for follow up. He reports that he is doing fairly well. He felt that gabapentin was making him tired so he has weaned dose. He is now taking gabapentin 300-600mg  at night only. He has not taken 800mg  dose. Pain waxes and wanes. He feels that as long as he stays active, pain is manageable.  He does continue to have generalized joint and muscle aches. He was diagnosed with arthritis and bursitis. Most nights he sleeps well. He seems to do better during the summer months. He continues trazodone 100mg  every night.    08/26/2019 ALL: CLELL TRAHAN is a 60 y.o. male here today for follow up for fibromyalgia. He continues gabapentin 800mg  twice daily and 300-600mg  at lunch. He was weaned of Cymbalta last year. Over the past year, pain has increased. He reports constant pain, all over. He has tried to increase activity. Pain worsens when he has been active the day before. He sleeps well most nights. Trazodone 100mg  helps. He uses CPAP nightly. He is followed by PCP for osteoarthritis of shoulder. He is participating in PT and feels that it is helping with strengthening and ROM.    HISTORY: (copied from [place] note on 08/20/2018)  Ms. Goswami is a 60 year old male with a history of fibromyalgia.  He returns today for follow-up.  He is currently taking 800 mg of gabapentin twice a day and taking 3 to 600 mg at lunch if needed.  At the last visit we increase Cymbalta 30 mg twice a day.  He reports that he has not really noticed any change since adding the Cymbalta.  He does also note that he has a rash on his stomach and in his legs that pops up intermittently.  He states that is not painful but it does itch.  He is unsure if this is related to the  additional dose of Cymbalta.  He continues to state most of his pain is in his hands.  Particularly around the thumb.  He also states that at night he has pain in the hips.  He reports that his sleep has improved since he started on CPAP.  He continues to take trazodone at bedtime.   HISTORY 02/27/18:  Mr. Alford Highland is a 60 year old male with a history of fibromyalgia. He returns today for follow-up. He states that the extra gabapentin has been beneficial. He reports that he typically takes it at lunch. There are times that he takes 600 mg instead of 300 and this tends to work well for him. He states on a daily basis he has constant pain in his hands shoulders and legs. He rates his pain a 5 out of 10. He states at least twice a week his pain will increase to 8 out of 10. He tries to maintain some physical activity. He states that he still is not sleeping well at night despite being on trazodone. He returns today for evaluation.   REVIEW OF SYSTEMS: Out of a complete 14 system review of symptoms, the patient complains only of the following symptoms, chronic pain and all other reviewed systems are negative.  ALLERGIES: Allergies  Allergen Reactions  . Flecainide Other (See Comments)    SOB 2014  . Multaq [  Dronedarone] Other (See Comments)    Nightmares    HOME MEDICATIONS: Outpatient Medications Prior to Visit  Medication Sig Dispense Refill  . aspirin EC 81 MG tablet Take 1 tablet (81 mg total) by mouth daily.    Marland Kitchen lisinopril (PRINIVIL,ZESTRIL) 20 MG tablet TAKE ONE TABLET BY MOUTH AT BEDTIME 30 tablet 2  . pantoprazole (PROTONIX) 40 MG tablet TAKE 1 TABLET (40 MG TOTAL) BY MOUTH DAILY BEFORE SUPPER. 90 tablet 0  . rosuvastatin (CRESTOR) 5 MG tablet Take 5 mg by mouth at bedtime.     . verapamil (CALAN-SR) 240 MG CR tablet Take 1 tablet (240 mg total) by mouth daily. 90 tablet 3  . gabapentin (NEURONTIN) 300 MG capsule Take 300 mg by mouth once as needed (pain).    .  traZODone (DESYREL) 100 MG tablet TAKE 1 TABLET AT BEDTIME 90 tablet 0  . gabapentin (NEURONTIN) 800 MG tablet TAKE 1 TABLET TWICE DAILY 180 tablet 3   No facility-administered medications prior to visit.    PAST MEDICAL HISTORY: Past Medical History:  Diagnosis Date  . Chronic insomnia 08/30/2017   improved with CPAP  . Essential hypertension, benign   . Fibromyalgia   . GERD (gastroesophageal reflux disease)   . Mixed hyperlipidemia   . Obstructive sleep apnea    uses CPAP,  followed by Dr Maxwell Caul  . Premature ventricular contractions   . Syncope    2010    PAST SURGICAL HISTORY: Past Surgical History:  Procedure Laterality Date  . APPENDECTOMY  ~ 2000  . BIOPSY  03/12/2018   Procedure: BIOPSY;  Surgeon: Rogene Houston, MD;  [facility];  Service: Endoscopy;;  gastric esophageal  . CHOLECYSTECTOMY  ~ 2000  . COLONOSCOPY N/A 03/12/2018   Procedure: COLONOSCOPY;  Surgeon: Rogene Houston, MD;  [facility];  Service: Endoscopy;  Laterality: N/A;  . ESOPHAGOGASTRODUODENOSCOPY N/A 03/12/2018   Procedure: ESOPHAGOGASTRODUODENOSCOPY (EGD);  Surgeon: Rogene Houston, MD;  [facility];  Service: Endoscopy;  Laterality: N/A;  7:30  . LEFT HEART CATHETERIZATION WITH CORONARY ANGIOGRAM N/A 01/07/2013   Procedure: LEFT HEART CATHETERIZATION WITH CORONARY ANGIOGRAM;  Surgeon: Minus Breeding, MD;  Location: [facility] CATH LAB;  Service: Cardiovascular;  Laterality: N/A;  . POLYPECTOMY  03/12/2018   Procedure: POLYPECTOMY;  Surgeon: Rogene Houston, MD;  [facility];  Service: Endoscopy;;  colon  . PVC ablation  04/14/13   parahisian PVC focus ablated by Dr Rayann Heman  . SUPRAVENTRICULAR TACHYCARDIA ABLATION N/A 04/14/2013   Procedure: VT Ablation;  Surgeon: Thompson Grayer, MD;  Location: [facility] C[facility] LAB;  Service: Cardiovascular;  Laterality: N/A;    FAMILY HISTORY: Family History  Problem Relation Age of Onset  . CAD Mother   . Rheum arthritis Mother   .  Colon cancer Father   . Atrial fibrillation Father   . Breast cancer Sister   . Lung cancer Sister   . Heart disease Maternal Grandfather   . Scleroderma Brother     SOCIAL HISTORY: Social History   Socioeconomic History  . Marital status: Married    Spouse name: Not on file  . Number of children: 2  . Years of education: Some college  . Highest education level: Not on file  Occupational History  . Occupation: N/A  Tobacco Use  . Smoking status: Never Smoker  . Smokeless tobacco: Never Used  Vaping Use  . Vaping Use: Never used  Substance and Sexual Activity  . Alcohol use:  No  . Drug use: No  . Sexual activity: Yes    Partners: Female    Comment: Married  Other Topics Concern  . Not on file  Social History Narrative   Lives at home w/ his wife   Right-handed   Caffeine: 4 drinks per day   Social Determinants of Health   Financial Resource Strain: Not on file  Food Insecurity: Not on file  Transportation Needs: Not on file  Physical Activity: Not on file  Stress: Not on file  Social Connections: Not on file  Intimate Partner Violence: Not on file      PHYSICAL EXAM  Vitals:   08/29/20 0817  BP: 113/70  Pulse: 81  Weight: 178 lb 12.8 oz (81.1 kg)  Height: 5\' 7"  (1.702 m)   Body mass index is 28 kg/m.  Generalized: Well developed, in no acute distress  Cardiology: normal rate and rhythm, no murmur noted Respiratory: clear to auscultation bilaterally  Neurological examination  Mentation: Alert oriented to time, place, history taking. Follows all commands speech and language fluent Cranial nerve II-XII: Pupils were equal round reactive to light. Extraocular movements were full, visual field were full on confrontational test. Facial sensation and strength were normal. Head turning and shoulder shrug  were normal and symmetric. Motor: The motor testing reveals 5 over 5 strength of all 4 extremities. Good symmetric motor tone is noted throughout.   Sensory: Sensory testing is intact to soft touch on all 4 extremities. No evidence of extinction is noted.  Coordination: Cerebellar testing reveals good finger-nose-finger and heel-to-shin bilaterally.  Gait and station: Gait is normal.  Reflexes: Deep tendon reflexes are symmetric and normal bilaterally.   DIAGNOSTIC DATA (LABS, IMAGING, TESTING) - I reviewed patient records, labs, notes, testing and imaging myself where available.  No flowsheet data found.   Lab Results  Component Value Date   WBC 4.6 04/29/2018   HGB 14.2 04/29/2018   HCT 42.7 04/29/2018   MCV 86.1 04/29/2018   PLT 198 04/29/2018      Component Value Date/Time   NA 137 07/03/2013 1040   K 4.8 07/03/2013 1040   CL 103 07/03/2013 1040   CO2 26 07/03/2013 1040   GLUCOSE 93 07/03/2013 1040   BUN 15 07/03/2013 1040   CREATININE 1.10 03/26/2018 1213   CALCIUM 10.0 07/03/2013 1040   No results found for: CHOL, HDL, LDLCALC, LDLDIRECT, TRIG, CHOLHDL No results found for: HGBA1C Lab Results  Component Value Date   VITAMINB12 313 04/29/2018   No results found for: TSH     ASSESSMENT AND PLAN 60 y.o. year old male  has a past medical history of Chronic insomnia (08/30/2017), Essential hypertension, benign, Fibromyalgia, GERD (gastroesophageal reflux disease), Mixed hyperlipidemia, Obstructive sleep apnea, Premature ventricular contractions, and Syncope. here with     ICD-10-CM   1. Fibromyalgia  M79.7     Jalien feels that he is doing fairly well.  He has weaned gabapentin dose.  He request 100 mg capsules to have more flexibility in dosing throughout the day as well as continued weaning.  I have discontinued 300 and 800mg  capsules.  We will write gabapentin 100 mg capsules to be taken up to 8 capsules daily.  He verbalizes understanding of dose change.  He will continue trazodone 100 mg at bedtime.  He may continue to wean medications as he feels appropriate but will do so gradually as discussed.  Healthy  lifestyle habits encouraged.  He will continue regular exercise.  He  may follow-up with primary care for continued refills, if willing.  Otherwise, he will return to see me in 1 year.  He verbalizes understanding and agreement with this plan.   No orders of the defined types were placed in this encounter.    Meds ordered this encounter  Medications  . gabapentin (NEURONTIN) 100 MG capsule    Sig: Take 100mg  at noon. 100mg  at dinner and 300-600mg  at bedtime.    Dispense:  240 capsule    Refill:  11    Order Specific Question:   Supervising Provider    Answer:   Melvenia Beam [id number]  . DISCONTD: traZODone (DESYREL) 100 MG tablet    Sig: Take 1 tablet (100 mg total) by mouth at bedtime.    Dispense:  90 tablet    Refill:  3    Order Specific Question:   Supervising Provider    Answer:   Melvenia Beam [id number]  . traZODone (DESYREL) 100 MG tablet    Sig: Take 1 tablet (100 mg total) by mouth at bedtime.    Dispense:  90 tablet    Refill:  3    Order Specific Question:   Supervising Provider    Answer:   Melvenia Beam [id number]      I spent 15 minutes with the patient. 50% of this time was spent counseling and educating patient on plan of care and medications.    Debbora Presto, FNP-C 08/29/2020, 8:48 AM [facility] [address] [place], [place] [address] [phone]

## 2020-08-29 NOTE — Patient Instructions (Addendum)
Below is our plan:  We will change gabapentin dose to 100mg  capsules. Take up to two capsules (100mg  twice daily) during the day and three to six capsules (300-600mg ) at night. You can wean dosing slowly as discussed if you wish. Continue trazodone 100mg  at bedtime.   Please make sure you are staying well hydrated. I recommend 50-60 ounces daily. Well balanced diet and regular exercise encouraged.    Please continue follow up with care team as directed.   Follow up with PCP for refills if willing, otherwise, follow up with [place] annually.   You may receive a survey regarding today's visit. I encourage you to leave honest feed back as I do use this information to improve patient care. Thank you for seeing me today!      Myofascial Pain Syndrome and Fibromyalgia Myofascial pain syndrome and fibromyalgia are both pain disorders. This pain may be felt mainly in your muscles.  Myofascial pain syndrome: ? Always has tender points in the muscle that will cause pain when pressed (trigger points). The pain may come and go. ? Usually affects your neck, upper back, and shoulder areas. The pain often radiates into your arms and hands.  Fibromyalgia: ? Has muscle pains and tenderness that come and go. ? Is often associated with fatigue and sleep problems. ? Has trigger points. ? Tends to be long-lasting (chronic), but is not life-threatening. Fibromyalgia and myofascial pain syndrome are not the same. However, they often occur together. If you have both conditions, each can make the other worse. Both are common and can cause enough pain and fatigue to make day-to-day activities difficult. Both can be hard to diagnose because their symptoms are common in many other conditions. What are the causes? The exact causes of these conditions are not known. What increases the risk? You are more likely to develop this condition if:  You have a family history of the condition.  You have certain triggers,  such as: ? Spine disorders. ? An injury (trauma) or other physical stressors. ? Being under a lot of stress. ? Medical conditions such as osteoarthritis, rheumatoid arthritis, or lupus. What are the signs or symptoms? Fibromyalgia The main symptom of fibromyalgia is widespread pain and tenderness in your muscles. Pain is sometimes described as stabbing, shooting, or burning. You may also have:  Tingling or numbness.  Sleep problems and fatigue.  Problems with attention and concentration (fibro fog). Other symptoms may include:  Bowel and bladder problems.  Headaches.  Visual problems.  Problems with odors and noises.  Depression or mood changes.  Painful menstrual periods (dysmenorrhea).  Dry skin or eyes. These symptoms can vary over time. Myofascial pain syndrome Symptoms of myofascial pain syndrome include:  Tight, ropy bands of muscle.  Uncomfortable sensations in muscle areas. These may include aching, cramping, burning, numbness, tingling, and weakness.  Difficulty moving certain parts of the body freely (poor range of motion). How is this diagnosed? This condition may be diagnosed by your symptoms and medical history. You will also have a physical exam. In general:  Fibromyalgia is diagnosed if you have pain, fatigue, and other symptoms for more than 3 months, and symptoms cannot be explained by another condition.  Myofascial pain syndrome is diagnosed if you have trigger points in your muscles, and those trigger points are tender and cause pain elsewhere in your body (referred pain). How is this treated? Treatment for these conditions depends on the type that you have.  For fibromyalgia: ? Pain medicines, such  as NSAIDs. ? Medicines for treating depression. ? Medicines for treating seizures. ? Medicines that relax the muscles.  For myofascial pain: ? Pain medicines, such as NSAIDs. ? Cooling and stretching of muscles. ? Trigger point  injections. ? Sound wave (ultrasound) treatments to stimulate muscles. Treating these conditions often requires a team of health care providers. These may include:  Your primary care provider.  Physical therapist.  Complementary health care providers, such as massage therapists or acupuncturists.  Psychiatrist for cognitive behavioral therapy.   Follow these instructions at home: Medicines  Take over-the-counter and prescription medicines only as told by your health care provider.  Do not drive or use heavy machinery while taking prescription pain medicine.  If you are taking prescription pain medicine, take actions to prevent or treat constipation. Your health care provider may recommend that you: ? Drink enough fluid to keep your urine pale yellow. ? Eat foods that are high in fiber, such as fresh fruits and vegetables, whole grains, and beans. ? Limit foods that are high in fat and processed sugars, such as fried or sweet foods. ? Take an over-the-counter or prescription medicine for constipation. Lifestyle  Exercise as directed by your health care provider or physical therapist.  Practice relaxation techniques to control your stress. You may want to try: ? Biofeedback. ? Visual imagery. ? Hypnosis. ? Muscle relaxation. ? Yoga. ? Meditation.  Maintain a healthy lifestyle. This includes eating a healthy diet and getting enough sleep.  Do not use any products that contain nicotine or tobacco, such as cigarettes and e-cigarettes. If you need help quitting, ask your health care provider.   General instructions  Talk to your health care provider about complementary treatments, such as acupuncture or massage.  Consider joining a support group with others who are diagnosed with this condition.  Do not do activities that stress or strain your muscles. This includes repetitive motions and heavy lifting.  Keep all follow-up visits as told by your health care provider. This is  important. Where to find more information  National Fibromyalgia Association: www.fmaware.[place]: www.arthritis.org  American Chronic Pain Association: www.theacpa.org Contact a health care provider if:  You have new symptoms.  Your symptoms get worse or your pain is severe.  You have side effects from your medicines.  You have trouble sleeping.  Your condition is causing depression or anxiety. Summary  Myofascial pain syndrome and fibromyalgia are pain disorders.  Myofascial pain syndrome has tender points in the muscle that will cause pain when pressed (trigger points). Fibromyalgia also has muscle pains and tenderness that come and go, but this condition is often associated with fatigue and sleep disturbances.  Fibromyalgia and myofascial pain syndrome are not the same but often occur together, causing pain and fatigue that make day-to-day activities difficult.  Treatment for fibromyalgia includes taking medicines to relax the muscles and medicines for pain, depression, or seizures. Treatment for myofascial pain syndrome includes taking medicines for pain, cooling and stretching of muscles, and injecting medicines into trigger points.  Follow your health care provider's instructions for taking medicines and maintaining a healthy lifestyle. This information is not intended to replace advice given to you by your health care provider. Make sure you discuss any questions you have with your health care provider. Document Revised: 11/14/2018 Document Reviewed: 08/07/2017 Elsevier Patient Education  2021 [employer].

## 2020-08-29 NOTE — Progress Notes (Signed)
I have read the note, and I agree with the clinical assessment and plan.  Jalaysha Skilton K Xcaret Morad   

## 2021-01-06 DIAGNOSIS — R5383 Other fatigue: Secondary | ICD-10-CM | POA: Diagnosis not present

## 2021-01-06 DIAGNOSIS — E782 Mixed hyperlipidemia: Secondary | ICD-10-CM | POA: Diagnosis not present

## 2021-01-06 DIAGNOSIS — E7801 Familial hypercholesterolemia: Secondary | ICD-10-CM | POA: Diagnosis not present

## 2021-01-06 DIAGNOSIS — K219 Gastro-esophageal reflux disease without esophagitis: Secondary | ICD-10-CM | POA: Diagnosis not present

## 2021-01-06 DIAGNOSIS — Z0001 Encounter for general adult medical examination with abnormal findings: Secondary | ICD-10-CM | POA: Diagnosis not present

## 2021-01-06 DIAGNOSIS — E7849 Other hyperlipidemia: Secondary | ICD-10-CM | POA: Diagnosis not present

## 2021-01-06 DIAGNOSIS — Z1329 Encounter for screening for other suspected endocrine disorder: Secondary | ICD-10-CM | POA: Diagnosis not present

## 2021-01-06 DIAGNOSIS — I10 Essential (primary) hypertension: Secondary | ICD-10-CM | POA: Diagnosis not present

## 2021-01-06 DIAGNOSIS — E559 Vitamin D deficiency, unspecified: Secondary | ICD-10-CM | POA: Diagnosis not present

## 2021-01-06 DIAGNOSIS — E7800 Pure hypercholesterolemia, unspecified: Secondary | ICD-10-CM | POA: Diagnosis not present

## 2021-01-09 DIAGNOSIS — Z0001 Encounter for general adult medical examination with abnormal findings: Secondary | ICD-10-CM | POA: Diagnosis not present

## 2021-01-09 DIAGNOSIS — I493 Ventricular premature depolarization: Secondary | ICD-10-CM | POA: Diagnosis not present

## 2021-01-09 DIAGNOSIS — K589 Irritable bowel syndrome without diarrhea: Secondary | ICD-10-CM | POA: Diagnosis not present

## 2021-01-09 DIAGNOSIS — I498 Other specified cardiac arrhythmias: Secondary | ICD-10-CM | POA: Diagnosis not present

## 2021-01-09 DIAGNOSIS — Z1331 Encounter for screening for depression: Secondary | ICD-10-CM | POA: Diagnosis not present

## 2021-01-09 DIAGNOSIS — E7849 Other hyperlipidemia: Secondary | ICD-10-CM | POA: Diagnosis not present

## 2021-01-09 DIAGNOSIS — S76012A Strain of muscle, fascia and tendon of left hip, initial encounter: Secondary | ICD-10-CM | POA: Diagnosis not present

## 2021-01-09 DIAGNOSIS — Z1389 Encounter for screening for other disorder: Secondary | ICD-10-CM | POA: Diagnosis not present

## 2021-03-01 ENCOUNTER — Other Ambulatory Visit: Payer: Self-pay

## 2021-03-01 MED ORDER — TRAZODONE HCL 100 MG PO TABS: 100.0000 mg | ORAL_TABLET | Freq: Every day | ORAL | 1 refills | Status: AC

## 2021-03-17 DIAGNOSIS — H5789 Other specified disorders of eye and adnexa: Secondary | ICD-10-CM | POA: Diagnosis not present

## 2021-06-05 DIAGNOSIS — I10 Essential (primary) hypertension: Secondary | ICD-10-CM | POA: Diagnosis not present

## 2021-06-05 DIAGNOSIS — E782 Mixed hyperlipidemia: Secondary | ICD-10-CM | POA: Diagnosis not present

## 2021-06-05 DIAGNOSIS — K219 Gastro-esophageal reflux disease without esophagitis: Secondary | ICD-10-CM | POA: Diagnosis not present

## 2021-06-05 DIAGNOSIS — E7800 Pure hypercholesterolemia, unspecified: Secondary | ICD-10-CM | POA: Diagnosis not present

## 2021-07-27 ENCOUNTER — Other Ambulatory Visit: Payer: Self-pay | Admitting: Internal Medicine

## 2021-08-11 ENCOUNTER — Ambulatory Visit: Payer: Medicare HMO | Admitting: Internal Medicine

## 2021-08-11 ENCOUNTER — Encounter: Payer: Self-pay | Admitting: Internal Medicine

## 2021-08-11 VITALS — BP 118/80 | HR 79 | Ht 68.5 in | Wt 182.0 lb

## 2021-08-11 DIAGNOSIS — I493 Ventricular premature depolarization: Secondary | ICD-10-CM | POA: Diagnosis not present

## 2021-08-11 DIAGNOSIS — I10 Essential (primary) hypertension: Secondary | ICD-10-CM

## 2021-08-11 DIAGNOSIS — I428 Other cardiomyopathies: Secondary | ICD-10-CM | POA: Diagnosis not present

## 2021-08-11 NOTE — Patient Instructions (Signed)
Medication Instructions:  Continue all current medications.  Labwork: none  Testing/Procedures: none  Follow-Up: 1 year - Dr.  Allred   Any Other Special Instructions Will Be Listed Below (If Applicable).   If you need a refill on your cardiac medications before your next appointment, please call your pharmacy.  

## 2021-08-11 NOTE — Progress Notes (Signed)
PCP: Curlene Labrum, MD   Primary EP: Dr Rayann Heman  Anthony Sherman is a 61 y.o. male who presents today for routine electrophysiology followup.  Since last being seen in our clinic, the patient reports doing very well.  He is active.  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  Arhythmia is controlled.  He states that he is not working because of concerns that his "arrhythmia could act up".  I have reassured him that he can work full time without limitations.  If his arrhythmia were to increase, we could consider additional measures at that time.  I do not feel that he has work related limitations due to his PVCs.   Today, he denies symptoms of palpitations, chest pain, shortness of breath,  lower extremity edema, dizziness, presyncope, or syncope.  The patient is otherwise without complaint today.   Past Medical History:  Diagnosis Date   Chronic insomnia 08/30/2017   improved with CPAP   Essential hypertension, benign    Fibromyalgia    GERD (gastroesophageal reflux disease)    Mixed hyperlipidemia    Obstructive sleep apnea    uses CPAP,  followed by Dr Maxwell Caul   Premature ventricular contractions    Syncope    2010   Past Surgical History:  Procedure Laterality Date   APPENDECTOMY  ~ 2000   BIOPSY  03/12/2018   Procedure: BIOPSY;  Surgeon: Rogene Houston, MD;  [facility];  Service: Endoscopy;;  gastric esophageal   CHOLECYSTECTOMY  ~ 2000   COLONOSCOPY N/A 03/12/2018   Procedure: COLONOSCOPY;  Surgeon: Rogene Houston, MD;  [facility];  Service: Endoscopy;  Laterality: N/A;   ESOPHAGOGASTRODUODENOSCOPY N/A 03/12/2018   Procedure: ESOPHAGOGASTRODUODENOSCOPY (EGD);  Surgeon: Rogene Houston, MD;  [facility];  Service: Endoscopy;  Laterality: N/A;  7:30   LEFT HEART CATHETERIZATION WITH CORONARY ANGIOGRAM N/A 01/07/2013   Procedure: LEFT HEART CATHETERIZATION WITH CORONARY ANGIOGRAM;  Surgeon: Minus Breeding, MD;   Location: [facility] CATH LAB;  Service: Cardiovascular;  Laterality: N/A;   POLYPECTOMY  03/12/2018   Procedure: POLYPECTOMY;  Surgeon: Rogene Houston, MD;  [facility];  Service: Endoscopy;;  colon   PVC ablation  04/14/13   parahisian PVC focus ablated by Dr Rayann Heman   SUPRAVENTRICULAR TACHYCARDIA ABLATION N/A 04/14/2013   Procedure: VT Ablation;  Surgeon: Thompson Grayer, MD;  Location: [facility] CATH LAB;  Service: Cardiovascular;  Laterality: N/A;    ROS- all systems are reviewed and negatives except as per HPI above  Current Outpatient Medications  Medication Sig Dispense Refill   aspirin EC 81 MG tablet Take 1 tablet (81 mg total) by mouth daily.     gabapentin (NEURONTIN) 100 MG capsule Take 100mg  at noon. 100mg  at dinner and 300-600mg  at bedtime. 240 capsule 11   lisinopril (PRINIVIL,ZESTRIL) 20 MG tablet TAKE ONE TABLET BY MOUTH AT BEDTIME 30 tablet 2   pantoprazole (PROTONIX) 40 MG tablet TAKE 1 TABLET (40 MG TOTAL) BY MOUTH DAILY BEFORE SUPPER. 90 tablet 0   rosuvastatin (CRESTOR) 5 MG tablet Take 5 mg by mouth at bedtime.      traZODone (DESYREL) 100 MG tablet Take 1 tablet (100 mg total) by mouth at bedtime. 90 tablet 1   verapamil (CALAN-SR) 240 MG CR tablet TAKE 1 TABLET (240 MG TOTAL) BY MOUTH DAILY. 90 tablet 3   No current facility-administered medications for this visit.    Physical Exam: Vitals:  08/11/21 0911  BP: 118/80  Pulse: 79  Weight: 182 lb (82.6 kg)  Height: 5' 8.5" (1.74 m)    GEN- The patient is well appearing, alert and oriented x 3 today.   Head- normocephalic, atraumatic Eyes-  Sclera clear, conjunctiva pink Ears- hearing intact Oropharynx- clear Lungs- Clear to ausculation bilaterally, normal work of breathing Heart- Regular rate and rhythm, no murmurs, rubs or gallops, PMI not laterally displaced GI- soft, NT, ND, + BS Extremities- no clubbing, cyanosis, or edema  Wt Readings from Last 3 Encounters:  08/11/21 182 lb (82.6 kg)  08/29/20 178 lb  12.8 oz (81.1 kg)  08/12/20 176 lb 9.6 oz (80.1 kg)    EKG tracing ordered today is personally reviewed and shows sinus with RBBB  Assessment and Plan:  PVCs Well controlled No work related limitations from my perspective  2. HTN Stable No change required today  3. OSA Stable No change required today  4. SOB/ atypical chest pain Chronic Currently improved Myoview 08/17/20 reviewed and normal  Return in a year  Thompson Grayer MD, [facility] 08/11/2021 9:21 AM

## 2021-09-20 DIAGNOSIS — G4733 Obstructive sleep apnea (adult) (pediatric): Secondary | ICD-10-CM | POA: Diagnosis not present

## 2021-09-20 DIAGNOSIS — R5381 Other malaise: Secondary | ICD-10-CM | POA: Diagnosis not present

## 2021-09-20 DIAGNOSIS — M25511 Pain in right shoulder: Secondary | ICD-10-CM | POA: Diagnosis not present

## 2021-09-20 DIAGNOSIS — B353 Tinea pedis: Secondary | ICD-10-CM | POA: Diagnosis not present

## 2021-09-20 DIAGNOSIS — Z6829 Body mass index (BMI) 29.0-29.9, adult: Secondary | ICD-10-CM | POA: Diagnosis not present

## 2021-10-03 DIAGNOSIS — M25511 Pain in right shoulder: Secondary | ICD-10-CM | POA: Diagnosis not present

## 2021-10-03 DIAGNOSIS — Z6830 Body mass index (BMI) 30.0-30.9, adult: Secondary | ICD-10-CM | POA: Diagnosis not present

## 2021-10-03 DIAGNOSIS — M62838 Other muscle spasm: Secondary | ICD-10-CM | POA: Diagnosis not present

## 2021-10-13 DIAGNOSIS — M25511 Pain in right shoulder: Secondary | ICD-10-CM | POA: Diagnosis not present

## 2021-10-13 DIAGNOSIS — M542 Cervicalgia: Secondary | ICD-10-CM | POA: Diagnosis not present

## 2021-10-13 DIAGNOSIS — Z6828 Body mass index (BMI) 28.0-28.9, adult: Secondary | ICD-10-CM | POA: Diagnosis not present

## 2021-12-03 DIAGNOSIS — K219 Gastro-esophageal reflux disease without esophagitis: Secondary | ICD-10-CM | POA: Diagnosis not present

## 2021-12-03 DIAGNOSIS — I10 Essential (primary) hypertension: Secondary | ICD-10-CM | POA: Diagnosis not present

## 2021-12-03 DIAGNOSIS — E782 Mixed hyperlipidemia: Secondary | ICD-10-CM | POA: Diagnosis not present

## 2021-12-03 DIAGNOSIS — E7800 Pure hypercholesterolemia, unspecified: Secondary | ICD-10-CM | POA: Diagnosis not present

## 2021-12-04 DIAGNOSIS — Z6827 Body mass index (BMI) 27.0-27.9, adult: Secondary | ICD-10-CM | POA: Diagnosis not present

## 2021-12-04 DIAGNOSIS — M25511 Pain in right shoulder: Secondary | ICD-10-CM | POA: Diagnosis not present

## 2022-01-08 DIAGNOSIS — E7849 Other hyperlipidemia: Secondary | ICD-10-CM | POA: Diagnosis not present

## 2022-01-08 DIAGNOSIS — I10 Essential (primary) hypertension: Secondary | ICD-10-CM | POA: Diagnosis not present

## 2022-01-08 DIAGNOSIS — K219 Gastro-esophageal reflux disease without esophagitis: Secondary | ICD-10-CM | POA: Diagnosis not present

## 2022-01-08 DIAGNOSIS — E559 Vitamin D deficiency, unspecified: Secondary | ICD-10-CM | POA: Diagnosis not present

## 2022-01-08 DIAGNOSIS — E782 Mixed hyperlipidemia: Secondary | ICD-10-CM | POA: Diagnosis not present

## 2022-01-08 DIAGNOSIS — R5383 Other fatigue: Secondary | ICD-10-CM | POA: Diagnosis not present

## 2022-01-08 DIAGNOSIS — Z1329 Encounter for screening for other suspected endocrine disorder: Secondary | ICD-10-CM | POA: Diagnosis not present

## 2022-01-11 DIAGNOSIS — L259 Unspecified contact dermatitis, unspecified cause: Secondary | ICD-10-CM | POA: Diagnosis not present

## 2022-01-11 DIAGNOSIS — D126 Benign neoplasm of colon, unspecified: Secondary | ICD-10-CM | POA: Diagnosis not present

## 2022-01-11 DIAGNOSIS — E7849 Other hyperlipidemia: Secondary | ICD-10-CM | POA: Diagnosis not present

## 2022-01-11 DIAGNOSIS — G4733 Obstructive sleep apnea (adult) (pediatric): Secondary | ICD-10-CM | POA: Diagnosis not present

## 2022-01-11 DIAGNOSIS — K589 Irritable bowel syndrome without diarrhea: Secondary | ICD-10-CM | POA: Diagnosis not present

## 2022-01-11 DIAGNOSIS — I493 Ventricular premature depolarization: Secondary | ICD-10-CM | POA: Diagnosis not present

## 2022-01-11 DIAGNOSIS — Z0001 Encounter for general adult medical examination with abnormal findings: Secondary | ICD-10-CM | POA: Diagnosis not present

## 2022-01-11 DIAGNOSIS — M797 Fibromyalgia: Secondary | ICD-10-CM | POA: Diagnosis not present

## 2022-01-11 DIAGNOSIS — I498 Other specified cardiac arrhythmias: Secondary | ICD-10-CM | POA: Diagnosis not present

## 2022-08-10 ENCOUNTER — Encounter: Payer: Medicare HMO | Admitting: Internal Medicine

## 2022-08-14 DIAGNOSIS — Z6829 Body mass index (BMI) 29.0-29.9, adult: Secondary | ICD-10-CM | POA: Diagnosis not present

## 2022-08-14 DIAGNOSIS — I498 Other specified cardiac arrhythmias: Secondary | ICD-10-CM | POA: Diagnosis not present

## 2022-08-14 DIAGNOSIS — R059 Cough, unspecified: Secondary | ICD-10-CM | POA: Diagnosis not present

## 2022-08-14 DIAGNOSIS — I10 Essential (primary) hypertension: Secondary | ICD-10-CM | POA: Diagnosis not present

## 2022-08-14 DIAGNOSIS — Z20828 Contact with and (suspected) exposure to other viral communicable diseases: Secondary | ICD-10-CM | POA: Diagnosis not present

## 2022-08-14 DIAGNOSIS — J209 Acute bronchitis, unspecified: Secondary | ICD-10-CM | POA: Diagnosis not present

## 2022-08-14 DIAGNOSIS — I493 Ventricular premature depolarization: Secondary | ICD-10-CM | POA: Diagnosis not present

## 2022-08-24 ENCOUNTER — Encounter: Payer: Medicare HMO | Admitting: Cardiovascular Disease

## 2022-09-18 ENCOUNTER — Ambulatory Visit: Payer: Medicare HMO | Attending: Internal Medicine | Admitting: Cardiovascular Disease

## 2022-09-18 VITALS — BP 140/96 | HR 78 | Ht 68.5 in | Wt 176.2 lb

## 2022-09-18 DIAGNOSIS — R002 Palpitations: Secondary | ICD-10-CM | POA: Diagnosis not present

## 2022-09-18 NOTE — Patient Instructions (Signed)
Medication Instructions:  Your physician recommends that you continue on your current medications as directed. Please refer to the Current Medication list given to you today.  *If you need a refill on your cardiac medications before your next appointment, please call your pharmacy*  Lab Work: None ordered  Testing/Procedures: None ordered  Follow-Up: At [facility], you and your health needs are our priority.  As part of our continuing mission to provide you with exceptional heart care, we have created designated Provider Care Teams.  These Care Teams include your primary Cardiologist (physician) and Advanced Practice Providers (APPs -  Physician Assistants and Nurse Practitioners) who all work together to provide you with the care you need, when you need it.  Your next appointment:   1 year(s)  The format for your next appointment:   In Person  Provider:   Doralee Albino, MD{

## 2022-09-18 NOTE — Progress Notes (Signed)
Electrophysiology Office Note:    Date:  09/18/2022   ID:  Anthony STEFFEK, DOB Feb 15, 1961, MRN [id number]  PCP:  Curlene Labrum, [place] Providers Cardiologist:  None Electrophysiologist:  Thompson Grayer, MD (Inactive)     Referring MD: Curlene Labrum, MD   History of Present Illness:    Anthony Sherman is a 62 y.o. male with a hx listed below, significant for frequent PVCs, who presents for follow-up.  He has had two ablation for PVCs in the past, one with Dr. Rayann Heman. He recalls being told that his most recent PVC was arising from adjacent to the AV node, and the ablation at this location "caused 30 seconds of heart block on the table." He has preferred not to have a repeat ablation since. He notes his symptoms are mild unless he over exerts himself.    Past Medical History:  Diagnosis Date   Chronic insomnia 08/30/2017   improved with CPAP   Essential hypertension, benign    Fibromyalgia    GERD (gastroesophageal reflux disease)    Mixed hyperlipidemia    Obstructive sleep apnea    uses CPAP,  followed by Dr Maxwell Caul   Premature ventricular contractions    Syncope    2010    Past Surgical History:  Procedure Laterality Date   APPENDECTOMY  ~ 2000   BIOPSY  03/12/2018   Procedure: BIOPSY;  Surgeon: Rogene Houston, MD;  [facility];  Service: Endoscopy;;  gastric esophageal   CHOLECYSTECTOMY  ~ 2000   COLONOSCOPY N/A 03/12/2018   Procedure: COLONOSCOPY;  Surgeon: Rogene Houston, MD;  [facility];  Service: Endoscopy;  Laterality: N/A;   ESOPHAGOGASTRODUODENOSCOPY N/A 03/12/2018   Procedure: ESOPHAGOGASTRODUODENOSCOPY (EGD);  Surgeon: Rogene Houston, MD;  [facility];  Service: Endoscopy;  Laterality: N/A;  7:30   LEFT HEART CATHETERIZATION WITH CORONARY ANGIOGRAM N/A 01/07/2013   Procedure: LEFT HEART CATHETERIZATION WITH CORONARY ANGIOGRAM;  Surgeon: Minus Breeding, MD;  Location: [facility] C[facility] LAB;  Service:  Cardiovascular;  Laterality: N/A;   POLYPECTOMY  03/12/2018   Procedure: POLYPECTOMY;  Surgeon: Rogene Houston, MD;  [facility];  Service: Endoscopy;;  colon   PVC ablation  04/14/13   parahisian PVC focus ablated by Dr Rayann Heman   SUPRAVENTRICULAR TACHYCARDIA ABLATION N/A 04/14/2013   Procedure: VT Ablation;  Surgeon: Thompson Grayer, MD;  Location: [facility] CATH LAB;  Service: Cardiovascular;  Laterality: N/A;    Current Medications: Current Meds  Medication Sig   aspirin EC 81 MG tablet Take 1 tablet (81 mg total) by mouth daily.   gabapentin (NEURONTIN) 100 MG capsule Take 134m at noon. 1027mat dinner and 300-60071mt bedtime.   ketoconazole (NIZORAL) 2 % cream Apply 1 Application topically daily as needed for irritation.   lisinopril (PRINIVIL,ZESTRIL) 20 MG tablet TAKE ONE TABLET BY MOUTH AT BEDTIME   pantoprazole (PROTONIX) 40 MG tablet TAKE 1 TABLET (40 MG TOTAL) BY MOUTH DAILY BEFORE SUPPER.   rosuvastatin (CRESTOR) 10 MG tablet Take 10 mg by mouth at bedtime.   traZODone (DESYREL) 100 MG tablet Take 1 tablet (100 mg total) by mouth at bedtime.   triamcinolone cream (KENALOG) 0.1 % Apply 1 Application topically as needed.   verapamil (CALAN-SR) 240 MG CR tablet TAKE 1 TABLET (240 MG TOTAL) BY MOUTH DAILY.     Allergies:   Flecainide and Multaq [dronedarone]   Social History   Socioeconomic History  Marital status: Married    Spouse name: Not on file   Number of children: 2   Years of education: Some college   Highest education level: Not on file  Occupational History   Occupation: N/A  Tobacco Use   Smoking status: Never   Smokeless tobacco: Never  Vaping Use   Vaping Use: Never used  Substance and Sexual Activity   Alcohol use: No   Drug use: No   Sexual activity: Yes    Partners: Female    Comment: Married  Other Topics Concern   Not on file  Social History Narrative   Lives at home w/ his wife   Right-handed   Caffeine: 4 drinks per day   Social  Determinants of Health   Financial Resource Strain: Not on file  Food Insecurity: Not on file  Transportation Needs: Not on file  Physical Activity: Not on file  Stress: Not on file  Social Connections: Not on file     Family History: The patient's family history includes Atrial fibrillation in his father; Breast cancer in his sister; CAD in his mother; Colon cancer in his father; Heart disease in his maternal grandfather; Lung cancer in his sister; Rheum arthritis in his mother; Scleroderma in his brother.  ROS:   Please see the history of present illness.    All other systems reviewed and are negative.  EKGs/Labs/Other Studies Reviewed Today:     08/17/20: Spect No perfusion defects. EF 55-65%  EKG:  Last EKG results: today - sinus rhythm, RBBB, LAD (?LAFB)   Recent Labs: No results found for requested labs within last 365 days.     Physical Exam:    VS:  BP (!) 140/96   Pulse 78   Ht 5' 8.5" (1.74 m)   Wt 176 lb 3.2 oz (79.9 kg)   SpO2 93%   BMI 26.40 kg/m     Wt Readings from Last 3 Encounters:  09/18/22 176 lb 3.2 oz (79.9 kg)  08/11/21 182 lb (82.6 kg)  08/29/20 178 lb 12.8 oz (81.1 kg)     GEN:  Well nourished, well developed in no acute distress CARDIAC: RRR, no murmurs, rubs, gallops RESPIRATORY:  Normal work of breathing MUSCULOSKELETAL: no edema    ASSESSMENT & PLAN:    PVCs: symptoms are reasonably well controlled. He has learned how to avoid triggers -- primarily by not over exerting himself. EF recently was normal on stress test.     Continue verapamil 240 mg     Try magnesium taurate.        Medication Adjustments/Labs and Tests Ordered: Current medicines are reviewed at length with the patient today.  Concerns regarding medicines are outlined above.  Orders Placed This Encounter  Procedures   EKG 12-Lead   No orders of the defined types were placed in this encounter.    Signed, Melida Quitter, MD  09/18/2022 4:29 PM    [place]

## 2022-10-04 ENCOUNTER — Other Ambulatory Visit: Payer: Self-pay

## 2022-10-04 MED ORDER — VERAPAMIL HCL ER 240 MG PO TBCR: 240.0000 mg | EXTENDED_RELEASE_TABLET | Freq: Every day | ORAL | 3 refills | Status: DC

## 2023-01-09 DIAGNOSIS — K219 Gastro-esophageal reflux disease without esophagitis: Secondary | ICD-10-CM | POA: Diagnosis not present

## 2023-01-09 DIAGNOSIS — E7849 Other hyperlipidemia: Secondary | ICD-10-CM | POA: Diagnosis not present

## 2023-01-09 DIAGNOSIS — I10 Essential (primary) hypertension: Secondary | ICD-10-CM | POA: Diagnosis not present

## 2023-01-09 DIAGNOSIS — E7801 Familial hypercholesterolemia: Secondary | ICD-10-CM | POA: Diagnosis not present

## 2023-01-09 DIAGNOSIS — Z0001 Encounter for general adult medical examination with abnormal findings: Secondary | ICD-10-CM | POA: Diagnosis not present

## 2023-01-15 DIAGNOSIS — G90A Postural orthostatic tachycardia syndrome (POTS): Secondary | ICD-10-CM | POA: Diagnosis not present

## 2023-01-15 DIAGNOSIS — M797 Fibromyalgia: Secondary | ICD-10-CM | POA: Diagnosis not present

## 2023-01-15 DIAGNOSIS — I10 Essential (primary) hypertension: Secondary | ICD-10-CM | POA: Diagnosis not present

## 2023-01-15 DIAGNOSIS — D126 Benign neoplasm of colon, unspecified: Secondary | ICD-10-CM | POA: Diagnosis not present

## 2023-01-15 DIAGNOSIS — Z800 Family history of malignant neoplasm of digestive organs: Secondary | ICD-10-CM | POA: Diagnosis not present

## 2023-01-15 DIAGNOSIS — E7849 Other hyperlipidemia: Secondary | ICD-10-CM | POA: Diagnosis not present

## 2023-01-15 DIAGNOSIS — Z0001 Encounter for general adult medical examination with abnormal findings: Secondary | ICD-10-CM | POA: Diagnosis not present

## 2023-01-15 DIAGNOSIS — I493 Ventricular premature depolarization: Secondary | ICD-10-CM | POA: Diagnosis not present

## 2023-01-15 DIAGNOSIS — K589 Irritable bowel syndrome without diarrhea: Secondary | ICD-10-CM | POA: Diagnosis not present

## 2023-01-16 ENCOUNTER — Encounter (INDEPENDENT_AMBULATORY_CARE_PROVIDER_SITE_OTHER): Payer: Self-pay | Admitting: *Deleted

## 2023-07-18 ENCOUNTER — Encounter (INDEPENDENT_AMBULATORY_CARE_PROVIDER_SITE_OTHER): Payer: Self-pay | Admitting: *Deleted

## 2023-07-18 ENCOUNTER — Other Ambulatory Visit: Payer: Self-pay | Admitting: Cardiovascular Disease

## 2023-07-22 DIAGNOSIS — R059 Cough, unspecified: Secondary | ICD-10-CM | POA: Diagnosis not present

## 2023-07-22 DIAGNOSIS — Z6829 Body mass index (BMI) 29.0-29.9, adult: Secondary | ICD-10-CM | POA: Diagnosis not present

## 2023-07-22 DIAGNOSIS — J209 Acute bronchitis, unspecified: Secondary | ICD-10-CM | POA: Diagnosis not present

## 2023-07-22 DIAGNOSIS — R030 Elevated blood-pressure reading, without diagnosis of hypertension: Secondary | ICD-10-CM | POA: Diagnosis not present

## 2023-07-23 ENCOUNTER — Encounter (INDEPENDENT_AMBULATORY_CARE_PROVIDER_SITE_OTHER): Payer: Self-pay | Admitting: *Deleted

## 2023-10-01 ENCOUNTER — Encounter (INDEPENDENT_AMBULATORY_CARE_PROVIDER_SITE_OTHER): Payer: Self-pay | Admitting: *Deleted

## 2023-10-01 ENCOUNTER — Telehealth (INDEPENDENT_AMBULATORY_CARE_PROVIDER_SITE_OTHER): Payer: Self-pay | Admitting: Gastroenterology

## 2023-10-01 MED ORDER — PEG 3350-KCL-NA BICARB-NACL 420 G PO SOLR: 4000.0000 mL | Freq: Once | ORAL | 0 refills | Status: AC

## 2023-10-01 NOTE — Telephone Encounter (Signed)
 Who is your primary care physician: Dr.Steven Burdine  Reasons for the colonoscopy:   Have you had a colonoscopy before?  yes  Do you have family history of colon cancer? Yes Father  Previous colonoscopy with polyps removed? yes  Do you have a history colorectal cancer?   no  Are you diabetic? If yes, Type 1 or Type 2?    no  Do you have a prosthetic or mechanical heart valve? no  Do you have a pacemaker/defibrillator?   no  Have you had endocarditis/atrial fibrillation? No Ventricular arythmia  Have you had joint replacement within the last 12 months?  no  Do you tend to be constipated or have to use laxatives? no  Do you have any history of drugs or alchohol?  no  Do you use supplemental oxygen?  no  Have you had a stroke or heart attack within the last 6 months? no  Do you take weight loss medication?  no  Do you take any blood-thinning medications such as: (aspirin, warfarin, Plavix, Aggrenox)  yes  If yes we need the name, milligram, dosage and who is prescribing doctor Aspirin 81 mg Current Outpatient Medications on File Prior to Visit  Medication Sig Dispense Refill   aspirin EC 81 MG tablet Take 1 tablet (81 mg total) by mouth daily.     gabapentin (NEURONTIN) 100 MG capsule Take 100mg  at noon. 100mg  at dinner and 300-600mg  at bedtime. 240 capsule 11   ketoconazole (NIZORAL) 2 % cream Apply 1 Application topically daily as needed for irritation. (Patient not taking: Reported on 10/01/2023)     lisinopril (PRINIVIL,ZESTRIL) 20 MG tablet TAKE ONE TABLET BY MOUTH AT BEDTIME 30 tablet 2   pantoprazole (PROTONIX) 40 MG tablet TAKE 1 TABLET (40 MG TOTAL) BY MOUTH DAILY BEFORE SUPPER. 90 tablet 0   rosuvastatin (CRESTOR) 10 MG tablet Take 10 mg by mouth at bedtime.     rosuvastatin (CRESTOR) 5 MG tablet Take 5 mg by mouth at bedtime.  (Patient not taking: Reported on 10/01/2023)     traZODone (DESYREL) 100 MG tablet Take 1 tablet (100 mg total) by mouth at bedtime. 90 tablet  1   triamcinolone cream (KENALOG) 0.1 % Apply 1 Application topically as needed. (Patient not taking: Reported on 10/01/2023)     verapamil (CALAN-SR) 240 MG CR tablet TAKE 1 TABLET EVERY DAY 90 tablet 3   No current facility-administered medications on file prior to visit.    Allergies  Allergen Reactions   Flecainide Other (See Comments)    SOB 2014   Multaq [Dronedarone] Other (See Comments)    Nightmares     Pharmacy: [facility]  Primary Insurance Name: Francine Graven   Best number where you can be reached: [phone]

## 2023-10-01 NOTE — Telephone Encounter (Signed)
 Pt returned call. Pt scheduled. Prep sent to pharmacy. PA pending via [employer]. Will mail instructions

## 2023-10-01 NOTE — Addendum Note (Signed)
 Addended by: Marlowe Shores on: 10/01/2023 03:57 PM   Modules accepted: Orders

## 2023-10-01 NOTE — Telephone Encounter (Signed)
 Referral completed, [facility] apt letter sent to PCP

## 2023-10-01 NOTE — Telephone Encounter (Signed)
 Ok to schedule.  Room 1/2  Thanks,  Vista Lawman, MD Gastroenterology and Hepatology [facility] Gastroenterology

## 2023-10-17 ENCOUNTER — Other Ambulatory Visit: Payer: Self-pay

## 2023-10-17 ENCOUNTER — Encounter (HOSPITAL_COMMUNITY): Payer: Self-pay

## 2023-10-17 ENCOUNTER — Encounter (HOSPITAL_COMMUNITY)
Admission: RE | Admit: 2023-10-17 | Discharge: 2023-10-17 | Disposition: A | Discharge status: Home / Self Care | Payer: Medicare HMO | Source: Ambulatory Visit | Attending: Gastroenterology | Admitting: Gastroenterology

## 2023-10-17 HISTORY — DX: Failed or difficult intubation, initial encounter: T88.4XXA

## 2023-10-17 NOTE — Pre-Procedure Instructions (Signed)
 Attempted pre-op phone call. Left VM for him to call us back.

## 2023-10-21 NOTE — Anesthesia Preprocedure Evaluation (Signed)
 Anesthesia Evaluation  Patient identified by MRN, date of birth, ID band Patient awake    Reviewed: Allergy & Precautions, H&P , NPO status , Patient's Chart, lab work & pertinent test results, reviewed documented beta blocker date and time   History of Anesthesia Complications (+) DIFFICULT AIRWAY and history of anesthetic complications  Airway Mallampati: I  TM Distance: >3 FB Neck ROM: Full    Dental no notable dental hx. (+) Teeth Intact, Dental Advisory Given   Pulmonary shortness of breath, sleep apnea    Pulmonary exam normal breath sounds clear to auscultation       Cardiovascular hypertension, On Medications and On Home Beta Blockers Normal cardiovascular exam+ dysrhythmias Ventricular Tachycardia  Rhythm:Regular Rate:Normal     Neuro/Psych  Neuromuscular disease  negative psych ROS   GI/Hepatic Neg liver ROS,GERD  Medicated and Controlled,,  Endo/Other  negative endocrine ROS    Renal/GU negative Renal ROS  negative genitourinary   Musculoskeletal  (+)  Fibromyalgia -  Abdominal   Peds  Hematology negative hematology ROS (+)   Anesthesia Other Findings   Reproductive/Obstetrics negative OB ROS                             Anesthesia Physical Anesthesia Plan  ASA: 3  Anesthesia Plan: MAC and General   Post-op Pain Management: Minimal or no pain anticipated   Induction: Intravenous  PONV Risk Score and Plan: Propofol infusion  Airway Management Planned: Nasal Cannula and Natural Airway  Additional Equipment: None  Intra-op Plan:   Post-operative Plan:   Informed Consent: I have reviewed the patients History and Physical, chart, labs and discussed the procedure including the risks, benefits and alternatives for the proposed anesthesia with the patient or authorized representative who has indicated his/her understanding and acceptance.     Dental advisory  given  Plan Discussed with: CRNA  Anesthesia Plan Comments:         Anesthesia Quick Evaluation

## 2023-10-22 ENCOUNTER — Encounter (INDEPENDENT_AMBULATORY_CARE_PROVIDER_SITE_OTHER): Payer: Self-pay | Admitting: *Deleted

## 2023-10-22 ENCOUNTER — Ambulatory Visit (HOSPITAL_COMMUNITY)
Admission: RE | Admit: 2023-10-22 | Discharge: 2023-10-22 | Disposition: A | Discharge status: Home / Self Care | Payer: Medicare HMO | Attending: Gastroenterology | Admitting: Gastroenterology

## 2023-10-22 ENCOUNTER — Encounter (HOSPITAL_COMMUNITY)
Admission: RE | Disposition: A | Discharge status: Home / Self Care | Payer: Self-pay | Source: Home / Self Care | Attending: Gastroenterology

## 2023-10-22 ENCOUNTER — Ambulatory Visit (HOSPITAL_COMMUNITY): Payer: Self-pay | Admitting: Anesthesiology

## 2023-10-22 ENCOUNTER — Encounter (HOSPITAL_COMMUNITY): Payer: Self-pay | Admitting: Gastroenterology

## 2023-10-22 ENCOUNTER — Ambulatory Visit (HOSPITAL_BASED_OUTPATIENT_CLINIC_OR_DEPARTMENT_OTHER): Payer: Self-pay | Admitting: Anesthesiology

## 2023-10-22 DIAGNOSIS — Z1211 Encounter for screening for malignant neoplasm of colon: Secondary | ICD-10-CM | POA: Insufficient documentation

## 2023-10-22 DIAGNOSIS — K648 Other hemorrhoids: Secondary | ICD-10-CM

## 2023-10-22 DIAGNOSIS — D125 Benign neoplasm of sigmoid colon: Secondary | ICD-10-CM | POA: Diagnosis not present

## 2023-10-22 DIAGNOSIS — I10 Essential (primary) hypertension: Secondary | ICD-10-CM | POA: Diagnosis not present

## 2023-10-22 DIAGNOSIS — I472 Ventricular tachycardia, unspecified: Secondary | ICD-10-CM | POA: Insufficient documentation

## 2023-10-22 DIAGNOSIS — G4730 Sleep apnea, unspecified: Secondary | ICD-10-CM | POA: Diagnosis not present

## 2023-10-22 DIAGNOSIS — Z79899 Other long term (current) drug therapy: Secondary | ICD-10-CM | POA: Diagnosis not present

## 2023-10-22 DIAGNOSIS — K219 Gastro-esophageal reflux disease without esophagitis: Secondary | ICD-10-CM | POA: Diagnosis not present

## 2023-10-22 DIAGNOSIS — D123 Benign neoplasm of transverse colon: Secondary | ICD-10-CM

## 2023-10-22 DIAGNOSIS — Z860100 Personal history of colon polyps, unspecified: Secondary | ICD-10-CM

## 2023-10-22 DIAGNOSIS — G4733 Obstructive sleep apnea (adult) (pediatric): Secondary | ICD-10-CM | POA: Diagnosis not present

## 2023-10-22 DIAGNOSIS — Z800 Family history of malignant neoplasm of digestive organs: Secondary | ICD-10-CM | POA: Diagnosis not present

## 2023-10-22 DIAGNOSIS — K635 Polyp of colon: Secondary | ICD-10-CM | POA: Diagnosis not present

## 2023-10-22 DIAGNOSIS — K5730 Diverticulosis of large intestine without perforation or abscess without bleeding: Secondary | ICD-10-CM | POA: Insufficient documentation

## 2023-10-22 HISTORY — PX: SUBMUCOSAL INJECTION: SHX5543

## 2023-10-22 HISTORY — PX: COLONOSCOPY WITH PROPOFOL: SHX5780

## 2023-10-22 HISTORY — PX: POLYPECTOMY: SHX5525

## 2023-10-22 LAB — HM COLONOSCOPY

## 2023-10-22 SURGERY — COLONOSCOPY WITH PROPOFOL
Anesthesia: Monitor Anesthesia Care

## 2023-10-22 MED ORDER — SODIUM CHLORIDE 0.9% FLUSH: 3.0000 mL | INTRAVENOUS | Status: DC | PRN

## 2023-10-22 MED ORDER — SODIUM CHLORIDE 0.9% FLUSH: 3.0000 mL | Freq: Two times a day (BID) | INTRAVENOUS | Status: DC

## 2023-10-22 MED ORDER — STERILE WATER FOR IRRIGATION IR SOLN
Status: DC | PRN
  Administered 2023-10-22: 120 mL

## 2023-10-22 MED ORDER — PROPOFOL 10 MG/ML IV BOLUS
INTRAVENOUS | Status: DC | PRN
  Administered 2023-10-22: 50 mg via INTRAVENOUS
  Administered 2023-10-22: 20 mg via INTRAVENOUS

## 2023-10-22 MED ORDER — PROPOFOL 500 MG/50ML IV EMUL
INTRAVENOUS | Status: DC | PRN
  Administered 2023-10-22: 100 ug/kg/min via INTRAVENOUS

## 2023-10-22 MED ORDER — LIDOCAINE HCL (CARDIAC) PF 100 MG/5ML IV SOSY
PREFILLED_SYRINGE | INTRAVENOUS | Status: DC | PRN
Start: 2023-10-22 — End: 2023-10-22
  Administered 2023-10-22: 40 mg via INTRAVENOUS

## 2023-10-22 MED ORDER — PHENYLEPHRINE 80 MCG/ML (10ML) SYRINGE FOR IV PUSH (FOR BLOOD PRESSURE SUPPORT)
PREFILLED_SYRINGE | INTRAVENOUS | Status: DC | PRN
  Administered 2023-10-22: 160 ug via INTRAVENOUS
  Administered 2023-10-22: 80 ug via INTRAVENOUS
  Administered 2023-10-22 (×2): 160 ug via INTRAVENOUS

## 2023-10-22 MED ORDER — LACTATED RINGERS IV SOLN: INTRAVENOUS | Status: DC | PRN

## 2023-10-22 NOTE — H&P (Signed)
 Primary Care Physician:  Juliette Alcide, MD Primary Gastroenterologist:  Dr. Tasia Catchings  Pre-Procedure History & Physical: HPI:  SHRIHAN PUTT is a 63 y.o. male is here for a colonoscopy for colon cancer screening purposes.  family history of colorectal cancer in first degree relative.  No melena or hematochezia.  No abdominal pain or unintentional weight loss.  No change in bowel habits.  Overall feels well from a GI standpoint.  2019 : Colonoscopy  - One 6 mm polyp at the hepatic flexure, removed with a hot snare. Resected and retrieved. - Two small polyps in the transverse colon, removed with a cold snare. Resected and retrieved. - One 6 mm polyp in the distal transverse colon, removed with a hot snare. Resected and retrieved. - One 6 mm polyp at the splenic flexure, removed with a hot snare. Resected and retrieved. - Diverticulosis in the sigmoid colon and in the descending colon. - External and internal hemorrhoids.  Past Medical History:  Diagnosis Date   Chronic insomnia 08/30/2017   improved with CPAP   Difficult intubation    Essential hypertension, benign    Fibromyalgia    GERD (gastroesophageal reflux disease)    Mixed hyperlipidemia    Obstructive sleep apnea    uses CPAP,  followed by Dr Earl Gala   Premature ventricular contractions    Syncope    2010    Past Surgical History:  Procedure Laterality Date   APPENDECTOMY  ~ 2000   BIOPSY  03/12/2018   Procedure: BIOPSY;  Surgeon: Malissa Hippo, MD;  [facility];  Service: Endoscopy;;  gastric esophageal   CHOLECYSTECTOMY  ~ 2000   COLONOSCOPY N/A 03/12/2018   Procedure: COLONOSCOPY;  Surgeon: Malissa Hippo, MD;  [facility];  Service: Endoscopy;  Laterality: N/A;   ESOPHAGOGASTRODUODENOSCOPY N/A 03/12/2018   Procedure: ESOPHAGOGASTRODUODENOSCOPY (EGD);  Surgeon: Malissa Hippo, MD;  [facility];  Service: Endoscopy;  Laterality: N/A;  7:30   LEFT HEART CATHETERIZATION WITH CORONARY  ANGIOGRAM N/A 01/07/2013   Procedure: LEFT HEART CATHETERIZATION WITH CORONARY ANGIOGRAM;  Surgeon: Rollene Rotunda, MD;  Location: [facility] CATH LAB;  Service: Cardiovascular;  Laterality: N/A;   POLYPECTOMY  03/12/2018   Procedure: POLYPECTOMY;  Surgeon: Malissa Hippo, MD;  [facility];  Service: Endoscopy;;  colon   PVC ablation  04/14/13   parahisian PVC focus ablated by Dr Johney Frame   SUPRAVENTRICULAR TACHYCARDIA ABLATION N/A 04/14/2013   Procedure: VT Ablation;  Surgeon: Hillis Range, MD;  Location: [facility] CATH LAB;  Service: Cardiovascular;  Laterality: N/A;    Prior to Admission medications   Medication Sig Start Date End Date Taking? Authorizing Provider  aspirin EC 81 MG tablet Take 1 tablet (81 mg total) by mouth daily. 03/19/18  Yes Rehman, Joline Maxcy, MD  gabapentin (NEURONTIN) 100 MG capsule Take 100mg  at noon. 100mg  at dinner and 300-600mg  at bedtime. 08/29/20  Yes Lomax, Amy, NP  ketoconazole (NIZORAL) 2 % cream Apply 1 Application topically daily as needed for irritation. 09/20/21  Yes [provider]  lisinopril (PRINIVIL,ZESTRIL) 20 MG tablet TAKE ONE TABLET BY MOUTH AT BEDTIME 07/19/14  Yes Allred, Fayrene Fearing, MD  pantoprazole (PROTONIX) 40 MG tablet TAKE 1 TABLET (40 MG TOTAL) BY MOUTH DAILY BEFORE SUPPER. 02/09/20  Yes Tawni Pummel B, PA-C  rosuvastatin (CRESTOR) 10 MG tablet Take 10 mg by mouth at bedtime. 08/19/22  Yes [provider]  rosuvastatin (CRESTOR) 5 MG tablet Take 5 mg by mouth  at bedtime.   Yes [provider]  traZODone (DESYREL) 100 MG tablet Take 1 tablet (100 mg total) by mouth at bedtime. 03/01/21  Yes Lomax, Amy, NP  triamcinolone cream (KENALOG) 0.1 % Apply 1 Application topically as needed. 08/15/22  Yes [provider]  verapamil (CALAN-SR) 240 MG CR tablet TAKE 1 TABLET EVERY DAY 07/18/23  Yes Mealor, Roberts Gaudy, MD    Allergies as of 10/01/2023 - Review Complete 10/01/2023  Allergen Reaction Noted   Flecainide Other (See  Comments) 04/04/2016   Multaq [dronedarone] Other (See Comments) 04/18/2016    Family History  Problem Relation Age of Onset   CAD Mother    Rheum arthritis Mother    Colon cancer Father    Atrial fibrillation Father    Breast cancer Sister    Lung cancer Sister    Heart disease Maternal Grandfather    Scleroderma Brother     Social History   Socioeconomic History   Marital status: Married    Spouse name: Not on file   Number of children: 2   Years of education: Some college   Highest education level: Not on file  Occupational History   Occupation: N/A  Tobacco Use   Smoking status: Never   Smokeless tobacco: Never  Vaping Use   Vaping status: Never Used  Substance and Sexual Activity   Alcohol use: No   Drug use: No   Sexual activity: Yes    Partners: Female    Comment: Married  Other Topics Concern   Not on file  Social History Narrative   Lives at home w/ his wife   Right-handed   Caffeine: 4 drinks per day   Social Drivers of Health   Financial Resource Strain: Not on file  Food Insecurity: Not on file  Transportation Needs: Not on file  Physical Activity: Not on file  Stress: Not on file  Social Connections: Not on file  Intimate Partner Violence: Not At Risk (12/04/2021)   [facility]   Humiliation, Afraid, Rape, and Kick questionnaire    Fear of Current or Ex-Partner: No    Emotionally Abused: No    Physically Abused: No    Sexually Abused: No    Review of Systems: See HPI, otherwise negative ROS  Physical Exam: Vital signs in last 24 hours: Temp:  [98.7 F (37.1 C)] 98.7 F (37.1 C) (03/18 0641) Resp:  [18] 18 (03/18 0641) BP: (162-171)/(87-90) 162/90 (03/18 0652) SpO2:  [98 %-100 %] 100 % (03/18 0652) Weight:  [81.6 kg] 81.6 kg (03/18 0615)   General:   Alert,  Well-developed, well-nourished, pleasant and cooperative in NAD Head:  Normocephalic and atraumatic. Eyes:  Sclera clear, no icterus.    Conjunctiva pink. Ears:  Normal auditory acuity. Nose:  No deformity, discharge,  or lesions. Msk:  Symmetrical without gross deformities. Normal posture. Extremities:  Without clubbing or edema. Neurologic:  Alert and  oriented x4;  grossly normal neurologically. Skin:  Intact without significant lesions or rashes. Psych:  Alert and cooperative. Normal mood and affect.  Impression/Plan: Anthony Sherman is here for a colonoscopy to be performed for colon cancer screening purposes.  The risks of the procedure including infection, bleed, or perforation as well as benefits, limitations, alternatives and imponderables have been reviewed with the patient. Questions have been answered. All parties agreeable.

## 2023-10-22 NOTE — Op Note (Signed)
 [facility] Patient Name: Anthony Sherman Procedure Date: 10/22/2023 7:05 AM MRN: [id number] Date of Birth: [birth year] Attending MD: Sanjuan Dame , MD, [id number] CSN: [id number] Age: 63 Admit Type: Outpatient Procedure:                Colonoscopy Indications:              High risk colon cancer surveillance: Personal                            history of colonic polyps, Family history of colon                            cancer in a first-degree relative before age 67                            years Providers:                Sanjuan Dame, MD, Francoise Ceo RN, RN, Dyann Ruddle Referring MD:             Sanjuan Dame, MD Medicines:                Monitored Anesthesia Care Complications:            No immediate complications. Estimated Blood Loss:     Estimated blood loss was minimal. Procedure:                Pre-Anesthesia Assessment:                           - Prior to the procedure, a History and Physical                            was performed, and patient medications and                            allergies were reviewed. The patient's tolerance of                            previous anesthesia was also reviewed. The risks                            and benefits of the procedure and the sedation                            options and risks were discussed with the patient.                            All questions were answered, and informed consent                            was obtained. Prior Anticoagulants: The patient has                            taken no anticoagulant or antiplatelet agents  except for aspirin. ASA Grade Assessment: II - A                            patient with mild systemic disease. After reviewing                            the risks and benefits, the patient was deemed in                            satisfactory condition to undergo the procedure.                           After obtaining informed consent, the  colonoscope                            was passed under direct vision. Throughout the                            procedure, the patient's blood pressure, pulse, and                            oxygen saturations were monitored continuously. The                            [id number]) scope was introduced through the                            anus and advanced to the the cecum, identified by                            appendiceal orifice and ileocecal valve. The                            colonoscopy was performed without difficulty. The                            patient tolerated the procedure well. The quality                            of the bowel preparation was evaluated using the                            BBPS [facility] Bowel Preparation Scale) with scores                            of: Right Colon = 3, Transverse Colon = 3 and Left                            Colon = 3 (entire mucosa seen well with no residual                            staining, small fragments of stool or opaque  liquid). The total BBPS score equals 9. The                            ileocecal valve, appendiceal orifice, and rectum                            were photographed. Scope In: 7:41:50 AM Scope Out: 8:15:34 AM Scope Withdrawal Time: 0 hours 29 minutes 42 seconds  Total Procedure Duration: 0 hours 33 minutes 44 seconds  Findings:      Two sessile polyps were found in the sigmoid colon and hepatic flexure.       The polyps were 4 to 8 mm in size. These polyps were removed with a cold       snare. Resection and retrieval were complete.      A 17 mm polyp was found in the splenic flexure. The polyp was flat.       Preparations were made for mucosal resection. Demarcation of the lesion       was performed with high-definition white light and narrow band imaging       to clearly identify the boundaries of the lesion. Eleview was injected       to raise the lesion. Snare mucosal  resection was performed. Resection       and retrieval were complete. Resected tissue margins were examined and       clear of polyp tissue.      A few small-mouthed diverticula were found in the entire colon.      Non-bleeding internal hemorrhoids were found during retroflexion. The       hemorrhoids were small. Impression:               - Two 4 to 8 mm polyps in the sigmoid colon and at                            the hepatic flexure, removed with a cold snare.                            Resected and retrieved.                           - One 17 mm polyp at the splenic flexure, removed                            with mucosal resection. Resected and retrieved.                           - Diverticulosis in the entire examined colon.                           - Non-bleeding internal hemorrhoids.                           - Mucosal resection was performed. Resection and                            retrieval were complete. Moderate Sedation:      Per Anesthesia Care Recommendation:           -  Patient has a contact number available for                            emergencies. The signs and symptoms of potential                            delayed complications were discussed with the                            patient. Return to normal activities tomorrow.                            Written discharge instructions were provided to the                            patient.                           - Resume previous diet.                           - Continue present medications.                           - Await pathology results.                           - Repeat colonoscopy in 5 years for surveillance.                           - Return to primary care physician as previously                            scheduled. Procedure Code(s):        --- Professional ---                           [phone], Colonoscopy, flexible; with endoscopic                            mucosal resection                            45385, 59, Colonoscopy, flexible; with removal of                            tumor(s), polyp(s), or other lesion(s) by snare                            technique Diagnosis Code(s):        --- Professional ---                           Z86.010, Personal history of colonic polyps                           D12.5, Benign neoplasm of sigmoid colon  D12.3, Benign neoplasm of transverse colon (hepatic                            flexure or splenic flexure)                           K64.8, Other hemorrhoids                           Z80.0, Family history of malignant neoplasm of                            digestive organs                           K57.30, Diverticulosis of large intestine without                            perforation or abscess without bleeding CPT copyright 2022 American [employer]. All rights reserved. The codes documented in this report are preliminary and upon coder review may  be revised to meet current compliance requirements. Sanjuan Dame, MD Sanjuan Dame, MD 10/22/2023 8:20:51 AM This report has been signed electronically. Number of Addenda: 0

## 2023-10-22 NOTE — Discharge Instructions (Signed)

## 2023-10-22 NOTE — Transfer of Care (Signed)
 Immediate Anesthesia Transfer of Care Note  Patient: Anthony Sherman  Procedure(s) Performed: COLONOSCOPY WITH PROPOFOL INJECTION, SUBMUCOSAL POLYPECTOMY  Patient Location: Short Stay  Anesthesia Type:General  Level of Consciousness: awake  Airway & Oxygen Therapy: Patient Spontanous Breathing  Post-op Assessment: Report given to RN and Post -op Vital signs reviewed and stable  Post vital signs: Reviewed and stable  Last Vitals:  Vitals Value Taken Time  BP 79/48   Temp 36.4 C 10/22/23 0820  Pulse 80 10/22/23 0820  Resp 21 10/22/23 0820  SpO2 98 % 10/22/23 0820    Last Pain:  Vitals:   10/22/23 0820  TempSrc: Oral  PainSc: 0-No pain         Complications: No notable events documented.

## 2023-10-22 NOTE — Anesthesia Postprocedure Evaluation (Signed)
 Anesthesia Post Note  Patient: Anthony Sherman  Procedure(s) Performed: COLONOSCOPY WITH PROPOFOL INJECTION, SUBMUCOSAL POLYPECTOMY  Patient location during evaluation: PACU Anesthesia Type: MAC Level of consciousness: awake and alert Pain management: pain level controlled Vital Signs Assessment: post-procedure vital signs reviewed and stable Respiratory status: spontaneous breathing, nonlabored ventilation, respiratory function stable and patient connected to nasal cannula oxygen Cardiovascular status: blood pressure returned to baseline and stable Postop Assessment: no apparent nausea or vomiting Anesthetic complications: no   There were no known notable events for this encounter.   Last Vitals:  Vitals:   10/22/23 0652 10/22/23 0820  BP: (!) 162/90 107/69  Pulse:  80  Resp:  (!) 21  Temp:  36.4 C  SpO2: 100% 98%    Last Pain:  Vitals:   10/22/23 0820  TempSrc: Oral  PainSc: 0-No pain                 Chick Cousins L Chayce Robbins

## 2023-10-23 ENCOUNTER — Encounter (HOSPITAL_COMMUNITY): Payer: Self-pay | Admitting: Gastroenterology

## 2023-10-23 LAB — SURGICAL PATHOLOGY

## 2023-10-24 NOTE — Progress Notes (Signed)
 I reviewed the pathology results. Ann, can you send her a letter with the findings as described below please?  Repeat colonoscopy in 3 years  Thanks,  Vista Lawman, MD Gastroenterology and Hepatology [facility]  ---------------------------------------------------------------------------------------------  [facility] [address], [place], [place] [address] Phone:  [phone]   10/24/23 [name], [place]   Dear Anthony Sherman,  I am writing to inform you that the biopsies taken during your recent endoscopic examination showed:  FINAL MICROSCOPIC DIAGNOSIS:   A. COLON, SIGMOID/HEPATIC FLEXURE/SPLENIC FLEXURE, POLYPECTOMY:  - Tubular adenoma(s).  - No high grade dysplasia or malignancy.    What does this mean?   You had a total of 3 polyps removed. The pathology came back as "tubular adenoma." These findings are NOT cancer, but had the polyps remained in your colon, they could have turned into cancer.  Given these findings, it is recommended that your next colonoscopy be performed in 3 years as one the polyp removed was large in size .  Also I value your feedback , so if you get a survey , please take the time to fill it out and thank you for choosing [facility]  Please call us at [phone] if you have persistent problems or have questions about your condition that have not been fully answered at this time.  Sincerely,  Vista Lawman, MD Gastroenterology and Hepatology

## 2023-10-28 ENCOUNTER — Encounter (INDEPENDENT_AMBULATORY_CARE_PROVIDER_SITE_OTHER): Payer: Self-pay | Admitting: *Deleted

## 2024-01-15 DIAGNOSIS — R5383 Other fatigue: Secondary | ICD-10-CM | POA: Diagnosis not present

## 2024-01-15 DIAGNOSIS — Z1329 Encounter for screening for other suspected endocrine disorder: Secondary | ICD-10-CM | POA: Diagnosis not present

## 2024-01-15 DIAGNOSIS — I10 Essential (primary) hypertension: Secondary | ICD-10-CM | POA: Diagnosis not present

## 2024-01-15 DIAGNOSIS — E7849 Other hyperlipidemia: Secondary | ICD-10-CM | POA: Diagnosis not present

## 2024-01-15 DIAGNOSIS — Z125 Encounter for screening for malignant neoplasm of prostate: Secondary | ICD-10-CM | POA: Diagnosis not present

## 2024-01-15 DIAGNOSIS — E7800 Pure hypercholesterolemia, unspecified: Secondary | ICD-10-CM | POA: Diagnosis not present

## 2024-01-21 DIAGNOSIS — Z1331 Encounter for screening for depression: Secondary | ICD-10-CM | POA: Diagnosis not present

## 2024-01-21 DIAGNOSIS — E782 Mixed hyperlipidemia: Secondary | ICD-10-CM | POA: Diagnosis not present

## 2024-01-21 DIAGNOSIS — E7800 Pure hypercholesterolemia, unspecified: Secondary | ICD-10-CM | POA: Diagnosis not present

## 2024-01-21 DIAGNOSIS — Z1389 Encounter for screening for other disorder: Secondary | ICD-10-CM | POA: Diagnosis not present

## 2024-01-21 DIAGNOSIS — I10 Essential (primary) hypertension: Secondary | ICD-10-CM | POA: Diagnosis not present

## 2024-01-21 DIAGNOSIS — Z6828 Body mass index (BMI) 28.0-28.9, adult: Secondary | ICD-10-CM | POA: Diagnosis not present

## 2024-01-21 DIAGNOSIS — Z0001 Encounter for general adult medical examination with abnormal findings: Secondary | ICD-10-CM | POA: Diagnosis not present

## 2024-01-21 DIAGNOSIS — E7849 Other hyperlipidemia: Secondary | ICD-10-CM | POA: Diagnosis not present

## 2024-03-06 ENCOUNTER — Encounter: Payer: Self-pay | Admitting: Cardiovascular Disease

## 2024-03-06 ENCOUNTER — Ambulatory Visit: Attending: Cardiovascular Disease | Admitting: Cardiovascular Disease

## 2024-03-06 VITALS — BP 144/86 | HR 67 | Ht 68.5 in | Wt 172.0 lb

## 2024-03-06 DIAGNOSIS — I493 Ventricular premature depolarization: Secondary | ICD-10-CM

## 2024-03-06 NOTE — Progress Notes (Signed)
  Electrophysiology Office Note:    Date:  03/06/2024   ID:  Anthony Sherman, DOB 1960/12/15, MRN [id number]  PCP:  Lari Elspeth BRAVO, MD   [facility] Providers Cardiologist:  None Electrophysiologist:  Eulas BRAVO Furbish, MD     Referring MD: Lari Elspeth BRAVO, MD   History of Present Illness:    Anthony Sherman is a 63 y.o. male with a hx listed below, significant for frequent PVCs, who presents for follow-up.  He has had two ablation for PVCs in the past, one with Dr. Kelsie. He recalls being told that his most recent PVC was arising from adjacent to the AV node, and the ablation at this location caused 30 seconds of heart block on the table. He has preferred not to have a repeat ablation since.   Magnesium  taurate was started at her last visit.  He reports that his PVCs have essentially resolved.  He does feel occasional palpitations from time to time.  His energy levels are significantly improved.     EKGs/Labs/Other Studies Reviewed Today:     08/17/20: Spect No perfusion defects. EF 55-65%  EKG:  EKG Interpretation Date/Time:  Friday March 06 2024 15:11:04 EDT Ventricular Rate:  67 PR Interval:  156 QRS Duration:  144 QT Interval:  442 QTC Calculation: 467 R Axis:   38  Text Interpretation: Normal sinus rhythm Right bundle branch block No previous ECGs available Confirmed by Furbish Eulas [phone]) on 03/06/2024 3:21:44 PM     Recent Labs: No results found for requested labs within last 365 days.     Physical Exam:    VS:  BP (!) 144/86 (BP Location: Right Arm, Cuff Size: Normal)   Pulse 67   Ht 5' 8.5 (1.74 m)   Wt 172 lb (78 kg)   SpO2 97%   BMI 25.77 kg/m     Wt Readings from Last 3 Encounters:  03/06/24 172 lb (78 kg)  10/22/23 179 lb 14.3 oz (81.6 kg)  10/17/23 180 lb (81.6 kg)     GEN:  Well nourished, well developed in no acute distress CARDIAC: RRR, no murmurs, rubs, gallops RESPIRATORY:  Normal work of  breathing MUSCULOSKELETAL: no edema    ASSESSMENT & PLAN:    PVCs: symptoms are much improved on magnesium  tolerate     Continue verapamil  240 mg, magnesium  taurate        Medication Adjustments/Labs and Tests Ordered: Current medicines are reviewed at length with the patient today.  Concerns regarding medicines are outlined above.  Orders Placed This Encounter  Procedures   EKG 12-Lead   No orders of the defined types were placed in this encounter.    Signed, Eulas BRAVO Furbish, MD  03/06/2024 3:21 PM    [facility]

## 2024-03-06 NOTE — Patient Instructions (Signed)

## 2024-04-13 DIAGNOSIS — Z111 Encounter for screening for respiratory tuberculosis: Secondary | ICD-10-CM | POA: Diagnosis not present

## 2024-04-25 DIAGNOSIS — H5213 Myopia, bilateral: Secondary | ICD-10-CM | POA: Diagnosis not present

## 2024-05-06 ENCOUNTER — Other Ambulatory Visit: Payer: Self-pay | Admitting: Cardiovascular Disease

## 2024-07-14 DIAGNOSIS — M9903 Segmental and somatic dysfunction of lumbar region: Secondary | ICD-10-CM | POA: Diagnosis not present

## 2024-07-14 DIAGNOSIS — M47816 Spondylosis without myelopathy or radiculopathy, lumbar region: Secondary | ICD-10-CM | POA: Diagnosis not present

## 2024-07-14 DIAGNOSIS — M9902 Segmental and somatic dysfunction of thoracic region: Secondary | ICD-10-CM | POA: Diagnosis not present

## 2024-07-14 DIAGNOSIS — M5442 Lumbago with sciatica, left side: Secondary | ICD-10-CM | POA: Diagnosis not present
